# Patient Record
Sex: Female | Born: 1960 | Race: White | Hispanic: No | Marital: Married | Smoking: Former smoker
Health system: Southern US, Community
[De-identification: ages and names within clinical notes are randomized; demographics above are authoritative.]

## PROBLEM LIST (undated history)

## (undated) DIAGNOSIS — E042 Nontoxic multinodular goiter: Secondary | ICD-10-CM

## (undated) DIAGNOSIS — I4892 Unspecified atrial flutter: Secondary | ICD-10-CM

## (undated) DIAGNOSIS — R9430 Abnormal result of cardiovascular function study, unspecified: Secondary | ICD-10-CM

## (undated) DIAGNOSIS — F1011 Alcohol abuse, in remission: Secondary | ICD-10-CM

## (undated) DIAGNOSIS — M545 Low back pain, unspecified: Secondary | ICD-10-CM

## (undated) DIAGNOSIS — F329 Major depressive disorder, single episode, unspecified: Secondary | ICD-10-CM

## (undated) DIAGNOSIS — F1096 Alcohol use, unspecified with alcohol-induced persisting amnestic disorder: Secondary | ICD-10-CM

## (undated) DIAGNOSIS — F32A Depression, unspecified: Secondary | ICD-10-CM

## (undated) DIAGNOSIS — F0390 Unspecified dementia without behavioral disturbance: Secondary | ICD-10-CM

## (undated) DIAGNOSIS — IMO0002 Reserved for concepts with insufficient information to code with codable children: Secondary | ICD-10-CM

## (undated) HISTORY — DX: Depression, unspecified: F32.A

## (undated) HISTORY — PX: ANTERIOR CERVICAL DISCECTOMY: SHX1160

## (undated) HISTORY — DX: Nontoxic multinodular goiter: E04.2

## (undated) HISTORY — DX: Low back pain, unspecified: M54.50

## (undated) HISTORY — PX: OTHER SURGICAL HISTORY: SHX169

## (undated) HISTORY — DX: Reserved for concepts with insufficient information to code with codable children: IMO0002

## (undated) HISTORY — DX: Alcohol abuse, in remission: F10.11

## (undated) HISTORY — DX: Low back pain: M54.5

## (undated) HISTORY — DX: Unspecified atrial flutter: I48.92

## (undated) HISTORY — DX: Abnormal result of cardiovascular function study, unspecified: R94.30

## (undated) HISTORY — DX: Major depressive disorder, single episode, unspecified: F32.9

---

## 1987-04-23 HISTORY — PX: ABDOMINAL HYSTERECTOMY: SHX81

## 2000-12-19 ENCOUNTER — Encounter: Payer: Self-pay | Admitting: Neurosurgery

## 2000-12-19 ENCOUNTER — Ambulatory Visit (HOSPITAL_COMMUNITY): Admission: RE | Admit: 2000-12-19 | Discharge: 2000-12-19 | Payer: Self-pay | Admitting: Neurosurgery

## 2001-02-05 ENCOUNTER — Ambulatory Visit (HOSPITAL_COMMUNITY): Admission: RE | Admit: 2001-02-05 | Discharge: 2001-02-05 | Payer: Self-pay | Admitting: Neurosurgery

## 2001-02-05 ENCOUNTER — Encounter: Payer: Self-pay | Admitting: Neurosurgery

## 2002-04-22 HISTORY — PX: OTHER SURGICAL HISTORY: SHX169

## 2006-11-29 ENCOUNTER — Emergency Department (HOSPITAL_COMMUNITY): Admission: EM | Admit: 2006-11-29 | Discharge: 2006-11-29 | Payer: Self-pay | Admitting: Emergency Medicine

## 2008-02-09 LAB — CONVERTED CEMR LAB: Pap Smear: NORMAL

## 2009-12-21 ENCOUNTER — Encounter: Payer: Self-pay | Admitting: Internal Medicine

## 2009-12-24 ENCOUNTER — Ambulatory Visit: Payer: Self-pay | Admitting: Radiology

## 2009-12-24 ENCOUNTER — Emergency Department (HOSPITAL_BASED_OUTPATIENT_CLINIC_OR_DEPARTMENT_OTHER)
Admission: EM | Admit: 2009-12-24 | Discharge: 2009-12-24 | Payer: Self-pay | Source: Home / Self Care | Admitting: Emergency Medicine

## 2010-02-08 ENCOUNTER — Ambulatory Visit: Payer: Self-pay | Admitting: Diagnostic Radiology

## 2010-02-08 ENCOUNTER — Ambulatory Visit (HOSPITAL_BASED_OUTPATIENT_CLINIC_OR_DEPARTMENT_OTHER): Admission: RE | Admit: 2010-02-08 | Discharge: 2010-02-08 | Payer: Self-pay | Admitting: Internal Medicine

## 2010-02-08 ENCOUNTER — Ambulatory Visit: Payer: Self-pay | Admitting: Internal Medicine

## 2010-02-08 DIAGNOSIS — Z87891 Personal history of nicotine dependence: Secondary | ICD-10-CM | POA: Insufficient documentation

## 2010-02-08 DIAGNOSIS — F17200 Nicotine dependence, unspecified, uncomplicated: Secondary | ICD-10-CM

## 2010-02-08 HISTORY — DX: Personal history of nicotine dependence: Z87.891

## 2010-02-15 ENCOUNTER — Telehealth (INDEPENDENT_AMBULATORY_CARE_PROVIDER_SITE_OTHER): Payer: Self-pay | Admitting: *Deleted

## 2010-02-19 ENCOUNTER — Encounter: Payer: Self-pay | Admitting: Internal Medicine

## 2010-02-19 ENCOUNTER — Telehealth: Payer: Self-pay | Admitting: Internal Medicine

## 2010-02-21 ENCOUNTER — Encounter: Admission: RE | Admit: 2010-02-21 | Discharge: 2010-02-21 | Payer: Self-pay | Admitting: Internal Medicine

## 2010-02-21 LAB — HM MAMMOGRAPHY

## 2010-05-22 NOTE — Progress Notes (Signed)
Summary: mammogram question  Phone Note Call from Patient   Caller: Patient Call For: D. Thomos Lemons DO Summary of Call: Pt returned call [id number] results. Advised pt of results and that additional views are needed to further evaluate the calcifications. Pt states that she wants a copy of the results sent to her Gynecologist, Dr Okey Dupre @ [phone]. Pt states she does not want to have any unnecessary tests. Copy has been faxed.  Nicki Guadalajara Fergerson CMA Duncan Dull)  February 19, 2010 1:54 PM

## 2010-05-22 NOTE — Letter (Signed)
Summary: Health Screening Report  Health Screening Report   Imported By: Maryln Gottron 02/16/2010 12:47:54  _____________________________________________________________________  External Attachment:    Type:   Image     Comment:   External Document

## 2010-05-22 NOTE — Assessment & Plan Note (Signed)
Summary: TO EST /HEA   Vital Signs:  Patient profile:   50 year old female Height:      65.5 inches Weight:      149 pounds BMI:     24.51 O2 Sat:      99 % on Room air Temp:     97.8 degrees F oral Pulse rate:   74 / minute Pulse rhythm:   regular Resp:     20 per minute BP sitting:   112 / 72  (right arm) Cuff size:   regular  Vitals Entered By: Glendell Docker CMA (February 08, 2010 2:31 PM)  O2 Flow:  Room air CC: New Patient  Is Patient Diabetic? No Pain Assessment Patient in pain? no      Comments establish care-insurance policy   Primary Care Provider:  Dondra Spry DO  CC:  New Patient .  History of Present Illness: 50 y/o white female for routine cpx She denies chronic illness no chronic meds  she recently had blood work for [employer] lab results reviewed  pt is smoker since 1986 smokes 1 ppd she denies chronic cough,  sob or chest pain  Preventive Screening-Counseling & Management  Alcohol-Tobacco     Alcohol drinks/day: 1     Smoking Status: current     Packs/Day: 1.0     Year Started: 1985  Caffeine-Diet-Exercise     Caffeine use/day: 1 beverage daily     Does Patient Exercise: no  Past History:  Past Surgical History: Hysterectomy- 1989  (left oophorectomy) scar tissue removal 2004 C5 - C6, C6 - C7 anterior cervical diskectomy and fusion with allograft and anterior plating  Family History: Family History Other cancer- Mother deceased from "bone" cancer Father mid 99's - leukemia brother - DM II no CAD 3 siblings  Social History: Occupation: Community education officer) Married- 13 years  (2nd marriage) 1 son 40 ([place]) 1 daughter 1 ([place], [place]) Alcohol use-yes (social) Current smoker (1 ppd 1986)Smoking Status:  current Packs/Day:  1.0 Caffeine use/day:  1 beverage daily Does Patient Exercise:  no  Review of Systems  The patient denies fever, weight loss, weight gain, chest pain, syncope, abdominal pain, melena,  hematochezia, severe indigestion/heartburn, and depression.    Physical Exam  General:  alert, well-developed, and well-nourished.   Head:  normocephalic and atraumatic.   Eyes:  pupils equal, pupils round, and pupils reactive to light.   Ears:  R ear normal and L ear normal.   Mouth:  pharynx pink and moist.   Neck:  supple, full ROM, no masses, and no carotid bruits.   Lungs:  normal respiratory effort and normal breath sounds.   Heart:  normal rate, regular rhythm, and no gallop.   Abdomen:  soft, non-tender, normal bowel sounds, no hepatomegaly, and no splenomegaly.   Extremities:  No lower extremity edema Neurologic:  cranial nerves II-XII intact and gait normal.     Impression & Recommendations:  Problem # 1:  PREVENTIVE HEALTH CARE (ICD-V70.0) Reviewed adult health maintenance protocols.  Orders: Mammogram (Screening) (Mammo)  Mammogram: Declined (02/08/2010) Pap smear: normal (02/09/2008) Flu Vax: Declined (02/08/2010)     Problem # 2:  TOBACCO ABUSE (ICD-305.1) Assessment: Unchanged  Encouraged smoking cessation and discussed different methods for smoking cessation. 5 minutes spent on counseling pt  Patient Instructions: 1)  Schedule screening DEXA  2)  Please schedule a follow-up appointment in 1 year. 3)  Stop Smoking Tips: Choose a Quit date (within 1 week). Cut  down before the Quit date. decide what you will do as a substitute when you feel the urge to smoke(gum,toothpick,exercise). 4)  Use nicotine patches as directed   Orders Added: 1)  Mammogram (Screening) [Mammo] 2)  New Patient 40-64 years [99386]   Immunization History:  Influenza Immunization History:    Influenza:  declined (02/08/2010)   Contraindications/Deferment of Procedures/Staging:    Test/Procedure: Mammogram    Reason for deferment: patient declined     Test/Procedure: FLU VAX    Reason for deferment: patient declined   Immunization History:  Influenza Immunization  History:    Influenza:  Declined (02/08/2010)     Preventive Care Screening  Mammogram:    Date:  02/08/2010    Results:  Declined  Pap Smear:    Date:  02/09/2008    Results:  normal

## 2010-05-22 NOTE — Progress Notes (Signed)
  Phone Note Outgoing Call   Summary of Call: call pt - plz find out whether pt contacted about recent screening mammo results? Initial call taken by: D. Thomos Lemons DO,  February 15, 2010 5:15 PM  Follow-up for Phone Call        [facility] for Pt to CB if she is unaware of results Follow-up by: Payton Spark CMA,  February 16, 2010 9:49 AM

## 2010-05-22 NOTE — Letter (Signed)
Summary: Generic Letter  [facility]  [address]   [place], [place] [address]   Phone: [phone]  Fax: [phone]      02/08/2010    Kari Hoffman [address] [place], [place]  [address]     To Whom it may concern:  [name] 15-Jul-2060 had a physical exam on this date 02/08/2010. Blood pressure 112/72, Pulse 74, Respiration 20, BMI 24.51 Height 5'5" Pulse.   The following are recommendations screening mammogram, dexa, tobacco counseling.  Please call our office if additional information is needed          Sincerely,   Glendell Docker CMA Dr Thomos Lemons

## 2010-07-05 LAB — URINALYSIS, ROUTINE W REFLEX MICROSCOPIC
Bilirubin Urine: NEGATIVE
Ketones, ur: NEGATIVE mg/dL
Nitrite: NEGATIVE
Protein, ur: NEGATIVE mg/dL
Specific Gravity, Urine: 1.003 — ABNORMAL LOW (ref 1.005–1.030)
Urobilinogen, UA: 0.2 mg/dL (ref 0.0–1.0)

## 2010-07-05 LAB — URINE MICROSCOPIC-ADD ON

## 2010-09-07 NOTE — Op Note (Signed)
[facility]  Patient:    BERGEN, MELLE Visit Number: [id number] MRN: [id number]          Service Type: OTH Location: 3000 [id number] [id number] Attending Physician:  Donn Pierini Dictated by:   Julio Sicks, M.D. Proc. 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  PROCEDURE:  C5-6 and C6-7 anterior cervical diskectomy and fusion with allograft and anterior plating.  SURGEON:  Julio Sicks, M.D.  ASSISTANT:  Reinaldo Meeker, M.D.  ANESTHESIA:  General endotracheal.  INDICATION:  Ms. Cottrell is a 50 year old female with a history of chronic neck and left upper extremity pain, paresthesias, and weakness, consistent with both a left-sided C6 and C7 radiculopathy.  The patient has failed conservative management.  MRI scanning demonstrates rather marked spondylosis at C5-6 and C6-7 with compression of the exiting nerve root.  We discussed options available for management.  The patient decided to proceed with a C5-6 and C6-7 anterior cervical diskectomy and fusion with allograft and anterior plating for hopeful removal of her symptoms.  DESCRIPTION OF PROCEDURE:  Patient taken to the operating room, placed on the operating table in the supine position.  After an adequate level of anesthesia achieved, patient positioned supine with her neck slightly extended.  This was held in place with Holter traction.  The patients anterior cervical region was prepped and draped sterilely.  A 10 blade was used to make a linear skin incision on the right side.  This was carried down sharply to platysma.  The platysma was then divided vertically and dissection proceeded along the medial border of the sternocleidomastoid muscle and carotid sheath.   Trachea and esophagus were mobilized and retracted to the left.  The prevertebral fascia was stripped off the anterior spinal column.  The longus colli muscle was then elevated bilaterally using electrocautery.  Deep self-retaining retractor was placed.  Intraoperative fluoroscopy was used, and the C5-6 and C6-7 levels were confirmed.  The disk space was then incised with a 15 blade in rectangular fashion at both levels.  A wide disk space clean-out was then achieved at both levels using pituitary rongeurs, forward and backward-angled Karlin curettes, Kerrison rongeurs, and the high-speed drill.  All elements of the disk were removed down to the level of the posterior annulus at both levels.  The microscope was brought into the field and used throughout the remainder of the diskectomies.  Starting first at C5-6, the remaining aspect of the annulus and osteophytes were removed using the high-speed drill down to the level of the posterior longitudinal ligament.  The posterior longitudinal ligament was then elevated and resected in piecemeal fashion utilizing Kerrison rongeurs.  The underlying thecal sac was identified.  A wide central decompression was then performed.  Decompression then proceeded down to the anterior neural foramen.  The C6 nerve roots were identified bilaterally and widely decompressed throughout the course of their foramina.  After very thorough decompression had been achieved, the procedure was repeated at the C6-7 level, again without complication.  Both disk spaces were then irrigated with antibiotic solution.  Gelfoam was placed topically for hemostasis, found to be good.  The disk spaces were then distracted  and fibular wedge allografts were packed into place at each level.  An Atlantis anterior cervical plate was then placed over the C5, C6, and C7 levels.  This was then attached under fluoroscopic guidance utilizing variable-angled cancellous screws.  Two  screws each were placed at C5, C6, and C7.  All screws were found to be well-positioned.  Screws were given a final tightening and found to be solidly within bone.  The locking screws were engaged.  Retractor system was removed. Hemostasis achieved with the bipolar electrocautery.  Final images with the fluoroscopy revealed good position of bone grafts and hardware and proper operative level, with normal alignment of the spine.  The wound was inspected one final time for hemostasis.  It was then closed in routine fashion.  There were no apparent complications.  The patient tolerated the procedure well, and she returns to the recovery room postop. Dictated by:   Julio Sicks, M.D. Attending Physician:  Donn Pierini DD:  01/28/01 TD:  01/29/01 Job: [id number]

## 2010-11-09 ENCOUNTER — Encounter: Payer: Self-pay | Admitting: Internal Medicine

## 2010-11-09 ENCOUNTER — Ambulatory Visit (INDEPENDENT_AMBULATORY_CARE_PROVIDER_SITE_OTHER): Payer: BC Managed Care – PPO | Admitting: Internal Medicine

## 2010-11-09 DIAGNOSIS — J40 Bronchitis, not specified as acute or chronic: Secondary | ICD-10-CM

## 2010-11-09 MED ORDER — DOXYCYCLINE HYCLATE 100 MG PO TABS: 100.0000 mg | ORAL_TABLET | Freq: Two times a day (BID) | ORAL | Status: DC

## 2010-11-10 DIAGNOSIS — J40 Bronchitis, not specified as acute or chronic: Secondary | ICD-10-CM | POA: Insufficient documentation

## 2010-11-10 NOTE — Progress Notes (Signed)
  Subjective:    Patient ID: Kari Hoffman, female    DOB: 1960-05-29, 50 y.o.   MRN: [id number]  HPI Patient presents to clinic for evaluation of cough. Notes two-week history of cough, nasal congestion and drainage as well as bilateral year congestion and pressure. Does smoke tobacco. Has been attempting over-the-counter medication. Has improved but symptoms remain persistent. Cough is now clear. Denies fever, chills, hemoptysis, shortness of breath or wheezing. No other exacerbating or alleviating factors. No other complaints.  Reviewed past medical history, medications and allergies    Review of Systems see history of present illness     Objective:   Physical Exam    Physical Exam  Vitals reviewed. Constitutional:  appears well-developed and well-nourished. No distress.  HENT:  Head: Normocephalic and atraumatic.  Right Ear: Tympanic membrane, external ear and ear canal normal.  Left Ear: Tympanic membrane, external ear and ear canal normal.  Nose: Nose normal.  Mouth/Throat: Oropharynx is clear and moist. No oropharyngeal exudate.  Eyes: Conjunctivae and EOM are normal. Pupils are equal, round, and reactive to light. Right eye exhibits no discharge. Left eye exhibits no discharge. No scleral icterus.  Neck: Neck supple. No thyromegaly present.  Cardiovascular: Normal rate, regular rhythm and normal heart sounds.  Exam reveals no gallop and no friction rub.   No murmur heard. Pulmonary/Chest: Effort normal and breath sounds normal. No respiratory distress.  has no wheezes.  has no rales.  Lymphadenopathy:   no cervical adenopathy.  Neurological:  is alert.  Skin: Skin is warm and dry.  not diaphoretic.  Psychiatric: normal mood and affect.      Assessment & Plan:

## 2010-11-10 NOTE — Assessment & Plan Note (Signed)
Begin antibiotic therapy with doxycycline x10 days. Continue over-the-counter medication as needed.Followup if no improvement or worsening.

## 2010-11-14 ENCOUNTER — Encounter: Payer: Self-pay | Admitting: Internal Medicine

## 2010-11-16 ENCOUNTER — Telehealth: Payer: Self-pay | Admitting: Internal Medicine

## 2010-11-16 MED ORDER — AZITHROMYCIN 250 MG PO TABS: ORAL_TABLET | ORAL | Status: DC

## 2010-11-16 NOTE — Telephone Encounter (Signed)
Patient states that she has been taking doxyclicline that Dr. Rodena Medin prescribed her for her earache. Her is still bothering her and wonders if something else could be called in?

## 2010-11-16 NOTE — Telephone Encounter (Signed)
Call placed to patient at [phone], she was asked about allergies to Zithromax. She stated she does not think that she has had that medication before,and denies having medication allergy. She was informed Z -pak would be sent to the pharmacy, and if there was no improvement by Monday or Tuesday of next week to return for evaluation. Patient has verbalized understanding and agrees as instructed.

## 2010-11-16 NOTE — Telephone Encounter (Signed)
zpak is not allergic and no interactions

## 2010-11-19 ENCOUNTER — Ambulatory Visit (INDEPENDENT_AMBULATORY_CARE_PROVIDER_SITE_OTHER): Payer: BC Managed Care – PPO | Admitting: Internal Medicine

## 2010-11-19 ENCOUNTER — Encounter: Payer: Self-pay | Admitting: Internal Medicine

## 2010-11-19 DIAGNOSIS — J40 Bronchitis, not specified as acute or chronic: Secondary | ICD-10-CM

## 2010-11-19 MED ORDER — AZITHROMYCIN 250 MG PO TABS: ORAL_TABLET | ORAL | Status: AC

## 2010-11-19 NOTE — Progress Notes (Signed)
  Subjective:    Patient ID: Kari Hoffman, female    DOB: 1961/01/31, 50 y.o.   MRN: [id number]  HPI Pt presents to clinic for followup of uri. Recently treated with doxycycline followed by zpak which is currently day #5/7. Cough and nasal congestion improved but left ear fullness/pressure remains. No alleviating or exacerbating factors. No no other medications. No other complaints.  Reviewed pmh, medications and allergies.    Review of Systems  Constitutional: Negative for fever and chills.  HENT: Positive for ear pain and congestion.   Respiratory: Negative for cough and shortness of breath.         Objective:   Physical Exam  Nursing note and vitals reviewed. Constitutional: She appears well-developed and well-nourished. No distress.  HENT:  Head: Normocephalic and atraumatic.  Right Ear: Tympanic membrane, external ear and ear canal normal.  Left Ear: Tympanic membrane and ear canal normal.  Nose: Nose normal.  Mouth/Throat: Oropharynx is clear and moist. No oropharyngeal exudate.       Clear drainage in op  Eyes: Conjunctivae are normal. No scleral icterus.  Neck: Neck supple.  Pulmonary/Chest: Effort normal and breath sounds normal. No respiratory distress. She has no wheezes. She has no rales.  Neurological: She is alert.  Skin: Skin is warm and dry. She is not diaphoretic.  Psychiatric: She has a normal mood and affect.          Assessment & Plan:   No problem-specific assessment & plan notes found for this encounter.

## 2010-11-19 NOTE — Assessment & Plan Note (Signed)
Improving but with persistent ear sx's. Extend abx. Attempt otc claritin and pseudopheb.

## 2010-11-22 ENCOUNTER — Encounter: Payer: BC Managed Care – PPO | Admitting: Internal Medicine

## 2010-11-22 DIAGNOSIS — Z0289 Encounter for other administrative examinations: Secondary | ICD-10-CM

## 2012-01-10 ENCOUNTER — Ambulatory Visit (INDEPENDENT_AMBULATORY_CARE_PROVIDER_SITE_OTHER): Payer: BC Managed Care – PPO | Admitting: Family

## 2012-01-10 ENCOUNTER — Ambulatory Visit (HOSPITAL_BASED_OUTPATIENT_CLINIC_OR_DEPARTMENT_OTHER)
Admission: RE | Admit: 2012-01-10 | Discharge: 2012-01-10 | Disposition: A | Discharge status: Home / Self Care | Payer: BC Managed Care – PPO | Source: Ambulatory Visit | Attending: Family | Admitting: Family

## 2012-01-10 ENCOUNTER — Encounter: Payer: Self-pay | Admitting: Family

## 2012-01-10 VITALS — BP 110/70 | HR 60 | Temp 98.9°F | Resp 16 | Wt 173.0 lb

## 2012-01-10 DIAGNOSIS — R609 Edema, unspecified: Secondary | ICD-10-CM | POA: Insufficient documentation

## 2012-01-10 DIAGNOSIS — M25473 Effusion, unspecified ankle: Secondary | ICD-10-CM

## 2012-01-10 DIAGNOSIS — M25579 Pain in unspecified ankle and joints of unspecified foot: Secondary | ICD-10-CM | POA: Insufficient documentation

## 2012-01-10 DIAGNOSIS — M19079 Primary osteoarthritis, unspecified ankle and foot: Secondary | ICD-10-CM | POA: Insufficient documentation

## 2012-01-10 DIAGNOSIS — M25472 Effusion, left ankle: Secondary | ICD-10-CM

## 2012-01-10 LAB — URIC ACID: Uric Acid, Serum: 6.1 mg/dL (ref 2.4–7.0)

## 2012-01-10 NOTE — Progress Notes (Signed)
  Subjective:    Patient ID: Kari Hoffman, female    DOB: 1960/12/11, 51 y.o.   MRN: [id number]  HPI  Kari Hoffman is a 51 yr old female who presents today with chief complaint of left ankle swelling. Swelling has been present x 3 weeks.  Yesterday, swelling seemed to worsen.  Reports that the left ankle is tender.  She denies associated fever, calf pain/swelling, or shortness of breath.  Denies known injury.  Swelling worsens as the day goes on.   Review of Systems See HPI  No past medical history on file.  History   Social History  . Marital Status: Married    Spouse Name: N/A    Number of Children: 2  . 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 Smoking status: Former Smoker -- 1.0 packs/day    Quit date: 07/10/2011  . Smokeless tobacco: Not on file   Comment: smoking since 1986  . Alcohol Use: Yes     socially  . Drug Use: Not on file  . Sexually Active: Not on file   Other Topics Concern  . Not on file   Social History Narrative   Married 13 yrs (2nd marriage)    Past Surgical History  Procedure Date  . Anterior cervical discectomy   . Abdominal hysterectomy 1989    left oophorectomy  . Scar tissue removal  2004  . C5- c6, c6- c7 anterior cervical diskectomy and fusion with allograft and anterir plating     Family History  Problem Relation Age of Onset  . Cancer Mother     bone  . Leukemia Father   . Diabetes Brother     type 2  . Coronary artery disease Neg Hx     No Known Allergies  No current outpatient prescriptions on file prior to visit.    BP 110/70  Pulse 60  Temp 98.9 F (37.2 C) (Oral)  Resp 16  Wt 173 lb (78.472 kg)  SpO2 99%       Objective:   Physical Exam  Constitutional: She appears well-developed and well-nourished. No distress.  Musculoskeletal:       + left ankle swelling, no redness.  Full ROM, mild tenderness to palpation.  No calf swelling.           Assessment  & Plan:

## 2012-01-10 NOTE — Assessment & Plan Note (Signed)
Will check x-ray to rule out fracture as well as a uric acid level to evaluate for gout.  In meantime, recommended ace wrap and ibuprofen prn.  Pt instructed to call if she develops redness, if symptoms worsen, or if symptoms do not improve.

## 2012-01-10 NOTE — Patient Instructions (Addendum)
Please complete blood work prior to leaving. Complete x-ray on the first floor.  You may use ibuprofen as needed the next week.

## 2012-01-14 ENCOUNTER — Inpatient Hospital Stay (HOSPITAL_BASED_OUTPATIENT_CLINIC_OR_DEPARTMENT_OTHER): Admission: RE | Admit: 2012-01-14 | Payer: BC Managed Care – PPO | Source: Ambulatory Visit

## 2012-01-14 ENCOUNTER — Telehealth: Payer: Self-pay | Admitting: Family

## 2012-01-14 DIAGNOSIS — M25472 Effusion, left ankle: Secondary | ICD-10-CM

## 2012-01-14 NOTE — Telephone Encounter (Signed)
Notified pt that u/s will need to be done prior to appt with Dr Pearletha Forge tomorrow. R/s u/s for 11:30am. Pt has been made aware and is agreeable.

## 2012-01-14 NOTE — Telephone Encounter (Signed)
Notified pt. 

## 2012-01-14 NOTE — Telephone Encounter (Signed)
Spoke to pt re: her concerns.  She expressed frustration with not understanding cause of her swelling.  I expressed to her that it is likely related to a sprain, however will also set her up for a LLE doppler to exclude DVT and a consultation with Dr. Pearletha Forge for further evaluation.  She is agreeable to proceed. "I want to do what I need to do." She reports no swelling today- she is off of work today and has been elevating.  She has also applied an ACE ankle brace which is helping.  Offered to write her an excuse note for work, but she declines.  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 She expressed concern about possible kidney or liver disease being a cause- I discussed with her that it would be unusual to have unilateral swelling if this were the case.    Please call patient and let her know that I have scheduled her at 3:30 PM for a doppler in the imaging department.   Apt tomorrow with Dr. Pearletha Forge is arranged at 2:30 PM.

## 2012-01-15 ENCOUNTER — Ambulatory Visit (HOSPITAL_BASED_OUTPATIENT_CLINIC_OR_DEPARTMENT_OTHER)
Admission: RE | Admit: 2012-01-15 | Discharge: 2012-01-15 | Disposition: A | Discharge status: Home / Self Care | Payer: BC Managed Care – PPO | Source: Ambulatory Visit | Attending: Family | Admitting: Family

## 2012-01-15 ENCOUNTER — Ambulatory Visit (INDEPENDENT_AMBULATORY_CARE_PROVIDER_SITE_OTHER): Payer: BC Managed Care – PPO | Admitting: Family Medicine

## 2012-01-15 ENCOUNTER — Telehealth: Payer: Self-pay | Admitting: Family

## 2012-01-15 ENCOUNTER — Encounter: Payer: Self-pay | Admitting: Family Medicine

## 2012-01-15 VITALS — BP 115/77 | HR 60 | Ht 65.0 in | Wt 172.0 lb

## 2012-01-15 DIAGNOSIS — M7989 Other specified soft tissue disorders: Secondary | ICD-10-CM | POA: Insufficient documentation

## 2012-01-15 DIAGNOSIS — M25579 Pain in unspecified ankle and joints of unspecified foot: Secondary | ICD-10-CM

## 2012-01-15 DIAGNOSIS — M25472 Effusion, left ankle: Secondary | ICD-10-CM

## 2012-01-15 DIAGNOSIS — R609 Edema, unspecified: Secondary | ICD-10-CM

## 2012-01-15 DIAGNOSIS — M25572 Pain in left ankle and joints of left foot: Secondary | ICD-10-CM

## 2012-01-15 NOTE — Telephone Encounter (Signed)
Patient returned phone call. She would like a call back before 1:45. Best # [phone]

## 2012-01-15 NOTE — Telephone Encounter (Signed)
Left message requesting that pt return our call.  When pt calls back pls let her know that her doppler is negative for DVT.  It notes a small baker's cyst- which is common cyst behind the knee. Baker's cysts are not dangerous- but sometimes can cause knee swelling/stiffness if they become large.    I doubt that the baker's cyst is related to her ankle swelling.  I have forwarded these results to Dr. Pearletha Forge- he can take a closer look at her knee and the cyst.

## 2012-01-15 NOTE — Telephone Encounter (Signed)
Spoke with patient.  Reviewed ultrasound results with her. She verbalizes understanding.  She is to meet with Dr. Pearletha Forge today at 2:30.

## 2012-01-17 ENCOUNTER — Encounter: Payer: Self-pay | Admitting: Family Medicine

## 2012-01-17 NOTE — Progress Notes (Signed)
  Subjective:    Patient ID: Kari Hoffman, female    DOB: March 12, 1961, 51 y.o.   MRN: [id number]  PCP: Sandford Craze  HPI 51 yo F here for left ankle pain, swelling.  Patient reports for about 4 weeks has had insidious onset of left leg, ankle swelling. No bruising. No known injury but reports she may have twisted ankle coming out of restaurant about when this started - didn't recall problems walking after this though. Pain and swelling worse at end of day. No issues with right leg. She had x-rays which showed no fracture or other acute process - did have some DJD. Also had uric acid of 6.1 (no prior h/o gout), doppler u/s negative for dvt.  History reviewed. No pertinent past medical history.  No current outpatient prescriptions on file prior to visit.    Past Surgical History  Procedure Date  . Anterior cervical discectomy   . Abdominal hysterectomy 1989    left oophorectomy  . Scar tissue removal  2004  . C5- c6, c6- c7 anterior cervical diskectomy and fusion with allograft and anterir plating     No Known Allergies  History   Social History  . Marital Status: Married    Spouse Name: N/A    Number of Children: 2  . 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 Smoking status: Former Smoker -- 1.0 packs/day    Quit date: 07/10/2011  . Smokeless tobacco: Not on file   Comment: smoking since 1986  . Alcohol Use: Yes     socially  . Drug Use: Not on file  . Sexually Active: Not on file   Other Topics Concern  . Not on file   Social History Narrative   Married 13 yrs (2nd marriage)    Family History  Problem Relation Age of Onset  . Cancer Mother     bone  . Leukemia Father   . Diabetes Brother     type 2  . Coronary artery disease Neg Hx     BP 115/77  Pulse 60  Ht 5\' 5"  (1.651 m)  Wt 172 lb (78.019 kg)  BMI 28.62 kg/m2  Review of Systems See HPI above.    Objective:   Physical Exam Gen:  NAD  L ankle: 1-2+ pitting edema lower leg.  No other gross deformity, ecchymoses FROM with 5/5 strength all directions without pain. TTP over distal tibia and fibula mildly.  No ankle joint, malleolar, base 5th, talus, navicular, fibular head, other TTP about ankle. Negative ant drawer and talar tilt.   Negative syndesmotic compression. Thompsons test negative. NV intact distally.    Assessment & Plan:  1. Left ankle pain - Patient's ankle exam is benign without prior injury, normal x-rays, negative doppler u/s, normal uric acid (in some labs 6.0 is upper limit of normal however but by exam no erythema, warmth to suggest gouty attack).  Does not exercise regularly to suggest stress fracture of fibula or tibia.  Pitting lower leg edema with pain worse at end of day suggests chronic venous insuffiency.  CMP ordered to ensure her renal and liver function (albumin, protein specifically when low can lead to edema) are normal.  Assuming this is the case would try compression stockings at pressure (script written) and consider lasix.  I do not think further imaging of lower extremity would be beneficial based on her exam.

## 2012-01-20 ENCOUNTER — Telehealth: Payer: Self-pay | Admitting: Internal Medicine

## 2012-01-20 NOTE — Telephone Encounter (Signed)
Pt returned my call and was advised of below instructions.  Pt voices understanding.  She states that she has not been able to purchase stockings yet due to her work schedule. Result received and forwarded to Provider for review.  Please advise.

## 2012-01-20 NOTE — Telephone Encounter (Signed)
Spoke with pt at 7:45 pm.  Will review labs in AM when I return to office.  Discussed with pt that I reviewed case with Dr. Pearletha Forge and at this point her swelling seems most consistent with venous insufficiency.  Pending review of lab work we can hopefully add a prn diuretic to help with her swelling.  I also reviewed with pt that use of compression stockings, especially while standing at work will likely help with her swelling and discomfort.  Pt. Verbalized understanding.

## 2012-01-20 NOTE — Telephone Encounter (Signed)
Patient is requesting last lab results. Also, she states that her ankle is not any better since last week.

## 2012-01-20 NOTE — Telephone Encounter (Signed)
Had lab test Friday and is requesting result? Pt states swelling has increased and is moving up into her leg. States her leg feels very tight. Denies redness or warmth to touch. Reports that Dr Pearletha Forge recommended that she try wearing a stocking. [employer] and they will fax result. Pt would like test results and to know what she should do next?    Per verbal from Provider, awaiting result from [facility] and to advise pt to continue wearing stocking recommended by Dr Pearletha Forge. If pt develops redness of leg or temperature over night to be evaluated in the ER otherwise we will contact her tomorrow morning once we have received results. Attempted to reach pt and left message to return my call.

## 2012-01-21 DIAGNOSIS — M25572 Pain in left ankle and joints of left foot: Secondary | ICD-10-CM | POA: Insufficient documentation

## 2012-01-21 MED ORDER — FUROSEMIDE 20 MG PO TABS: 20.0000 mg | ORAL_TABLET | Freq: Every day | ORAL | Status: DC

## 2012-01-21 NOTE — Telephone Encounter (Signed)
Kari Hoffman- Pls call pt and let her know labs are normal- normal kidney function, normal liver function.  I recommend that she start furosemide 20mg  once daily in the morning as needed for swelling.  Wear support stockings during day and especially while at work. Follow up in 2 weeks.

## 2012-01-21 NOTE — Assessment & Plan Note (Signed)
Patient's ankle exam is benign without prior injury, normal x-rays, negative doppler u/s, normal uric acid (in some labs 6.0 is upper limit of normal however but by exam no erythema, warmth to suggest gouty attack). Does not exercise regularly to suggest stress fracture of fibula or tibia. Pitting lower leg edema with pain worse at end of day suggests chronic venous insuffiency. CMP ordered to ensure her renal and liver function (albumin, protein specifically when low can lead to edema) are normal. Assuming this is the case would try compression stockings at pressure (script written) and consider lasix. I do not think further imaging of lower extremity would be beneficial based on her exam.

## 2012-01-21 NOTE — Telephone Encounter (Signed)
Notified pt and she voices understanding. Follow up scheduled for 02/05/12 at 9:30am.

## 2012-02-05 ENCOUNTER — Encounter: Payer: Self-pay | Admitting: Family

## 2012-02-05 ENCOUNTER — Ambulatory Visit: Payer: BC Managed Care – PPO | Admitting: Family

## 2012-02-05 ENCOUNTER — Ambulatory Visit (INDEPENDENT_AMBULATORY_CARE_PROVIDER_SITE_OTHER): Payer: BC Managed Care – PPO | Admitting: Family

## 2012-02-05 VITALS — BP 132/78 | HR 81 | Temp 97.6°F | Resp 12 | Ht 66.0 in | Wt 169.0 lb

## 2012-02-05 DIAGNOSIS — M25473 Effusion, unspecified ankle: Secondary | ICD-10-CM

## 2012-02-05 DIAGNOSIS — M25472 Effusion, left ankle: Secondary | ICD-10-CM

## 2012-02-05 DIAGNOSIS — R609 Edema, unspecified: Secondary | ICD-10-CM

## 2012-02-05 LAB — BASIC METABOLIC PANEL
BUN: 11 mg/dL (ref 6–23)
Chloride: 100 mEq/L (ref 96–112)
Creat: 0.74 mg/dL (ref 0.50–1.10)

## 2012-02-05 MED ORDER — FUROSEMIDE 20 MG PO TABS: 20.0000 mg | ORAL_TABLET | Freq: Every day | ORAL | Status: DC

## 2012-02-05 NOTE — Patient Instructions (Addendum)
Please schedule a follow up appointment in 6 months.   

## 2012-02-05 NOTE — Progress Notes (Signed)
  Subjective:    Patient ID: Kari Hoffman, female    DOB: 19-Jun-1960, 51 y.o.   MRN: [id number]  HPI  Kari Hoffman is a 51 yr old female who presents today for follow up of her LLE edema. She has been using support hose and furosemide without problem. Reports that she has had no further issues with edema with this treatment.    Review of Systems See HPI  History reviewed. No pertinent past medical history.  History   Social History  . Marital Status: Married    Spouse Name: N/A    Number of Children: 2  . 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 Smoking status: Former Smoker -- 1.0 packs/day    Quit date: 07/10/2011  . Smokeless tobacco: Not on file   Comment: smoking since 1986  . Alcohol Use: Yes     socially  . Drug Use: Not on file  . Sexually Active: Not on file   Other Topics Concern  . Not on file   Social History Narrative   Married 13 yrs (2nd marriage)    Past Surgical History  Procedure Date  . Anterior cervical discectomy   . Abdominal hysterectomy 1989    left oophorectomy  . Scar tissue removal  2004  . C5- c6, c6- c7 anterior cervical diskectomy and fusion with allograft and anterir plating     Family History  Problem Relation Age of Onset  . Cancer Mother     bone  . Leukemia Father   . Diabetes Brother     type 2  . Coronary artery disease Neg Hx     No Known Allergies  Current Outpatient Prescriptions on File Prior to Visit  Medication Sig Dispense Refill  . DISCONTD: furosemide (LASIX) 20 MG tablet Take 1 tablet (20 mg total) by mouth daily.  30 tablet  0    BP 132/78  Pulse 81  Temp 97.6 F (36.4 C) (Oral)  Resp 12  Ht 5\' 6"  (1.676 m)  Wt 169 lb 0.6 oz (76.676 kg)  BMI 27.28 kg/m2  SpO2 98%       Objective:   Physical Exam  Constitutional: She appears well-developed and well-nourished. No distress.  Cardiovascular: Normal rate and regular rhythm.   No murmur  heard. Pulmonary/Chest: Effort normal and breath sounds normal. No respiratory distress. She has no wheezes. She has no rales. She exhibits no tenderness.  Musculoskeletal: She exhibits no edema.  Psychiatric: Her behavior is normal. Judgment and thought content normal.          Assessment & Plan:

## 2012-02-05 NOTE — Assessment & Plan Note (Signed)
Resolved.  Likely due to venous insufficiency.  Reviewed with patient in detail. She expresses concern about not wanting to take medication.  I explained to patient that if this is the case she can use the support stockings daily and the furosemide as needed.  Obtain bmet today.  I did ask pt if she was up to date on her preventative measures. She tells me that she does not wish to pusue these measures. Specifically, she does not wish to have mammogram or colonoscopy.  "I do not see any need for it."

## 2012-02-07 ENCOUNTER — Encounter: Payer: Self-pay | Admitting: Family

## 2012-08-05 ENCOUNTER — Ambulatory Visit: Payer: BC Managed Care – PPO | Admitting: Family

## 2012-12-15 ENCOUNTER — Ambulatory Visit (INDEPENDENT_AMBULATORY_CARE_PROVIDER_SITE_OTHER): Payer: BC Managed Care – PPO | Admitting: Family

## 2012-12-15 ENCOUNTER — Encounter: Payer: Self-pay | Admitting: Family

## 2012-12-15 VITALS — BP 130/80 | HR 88 | Temp 98.0°F | Resp 16 | Ht 66.75 in | Wt 178.0 lb

## 2012-12-15 DIAGNOSIS — Z13220 Encounter for screening for lipoid disorders: Secondary | ICD-10-CM

## 2012-12-15 DIAGNOSIS — E785 Hyperlipidemia, unspecified: Secondary | ICD-10-CM

## 2012-12-15 DIAGNOSIS — Z0000 Encounter for general adult medical examination without abnormal findings: Secondary | ICD-10-CM

## 2012-12-15 LAB — BASIC METABOLIC PANEL
BUN: 11 mg/dL (ref 6–23)
Chloride: 102 mEq/L (ref 96–112)
Creat: 0.89 mg/dL (ref 0.50–1.10)
Glucose, Bld: 118 mg/dL — ABNORMAL HIGH (ref 70–99)
Potassium: 4.2 mEq/L (ref 3.5–5.3)

## 2012-12-15 LAB — LIPID PANEL
Cholesterol: 171 mg/dL (ref 0–200)
Total CHOL/HDL Ratio: 2.2 Ratio
Triglycerides: 67 mg/dL (ref <150)
VLDL: 13 mg/dL (ref 0–40)

## 2012-12-15 NOTE — Progress Notes (Signed)
  Subjective:    Patient ID: Kari Hoffman, female    DOB: Oct 29, 1960, 52 y.o.   MRN: [id number]  HPI  Kari Hoffman is a 52 yr old female who presents today to have a form filled for work.  She declines physical exam.  Form is requiring lipid panel. She is not fasting today. Offered to have her return fasting tomorrow, but she declines. "I'm here now."    Review of Systems See HPI  History reviewed. No pertinent past medical history.  History   Social History  . Marital Status: Married    Spouse Name: N/A    Number of Children: 2  . 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 Smoking status: Former Smoker -- 1.00 packs/day    Quit date: 07/10/2011  . Smokeless tobacco: Not on file     Comment: smoking since 1986  . Alcohol Use: Yes     Comment: socially  . Drug Use: Not on file  . Sexual Activity: Not on file   Other Topics Concern  . Not on file   Social History Narrative   Married 13 yrs (2nd marriage)    Past Surgical History  Procedure Laterality Date  . Anterior cervical discectomy    . Abdominal hysterectomy  1989    left oophorectomy  . Scar tissue removal   2004  . C5- c6, c6- c7 anterior cervical diskectomy and fusion with allograft and anterir plating      Family History  Problem Relation Age of Onset  . Cancer Mother     bone  . Leukemia Father   . Diabetes Brother     type 2  . Coronary artery disease Neg Hx     No Known Allergies  No current outpatient prescriptions on file prior to visit.   No current facility-administered medications on file prior to visit.    BP 130/80  Pulse 88  Temp(Src) 98 F (36.7 C) (Oral)  Resp 16  Ht 5' 6.75" (1.695 m)  Wt 178 lb 0.6 oz (80.758 kg)  BMI 28.11 kg/m2  SpO2 97%       Objective:   Physical Exam  Constitutional: She appears well-developed and well-nourished. No distress.  Psychiatric: Judgment and thought content normal. Cognition and memory  are normal.  Pt irritable today          Assessment & Plan:

## 2012-12-15 NOTE — Assessment & Plan Note (Addendum)
Check non-fasting lipid panel today.  Form also requesting glucose level.  Obtain bmet.

## 2012-12-15 NOTE — Patient Instructions (Addendum)
Please complete lab work prior to leaving.   

## 2012-12-16 ENCOUNTER — Telehealth: Payer: Self-pay | Admitting: Family

## 2012-12-16 NOTE — Telephone Encounter (Signed)
Left message on home # to return my call. Form faxed to [phone] and original mailed to pt.

## 2012-12-16 NOTE — Telephone Encounter (Signed)
Patient returned phone call. Best # [phone]

## 2012-12-16 NOTE — Telephone Encounter (Signed)
Notified pt. 

## 2012-12-16 NOTE — Telephone Encounter (Signed)
Please call pt and let her know that her form is complete.  Cholesterol levels look good. Sugar was mildly elevated but not in diabetic range.  Likely elevated as she was not fasting at time of the test.

## 2013-03-10 ENCOUNTER — Telehealth: Payer: Self-pay | Admitting: Family

## 2013-03-10 ENCOUNTER — Encounter: Payer: Self-pay | Admitting: Family

## 2013-03-10 ENCOUNTER — Ambulatory Visit (INDEPENDENT_AMBULATORY_CARE_PROVIDER_SITE_OTHER): Payer: BC Managed Care – PPO | Admitting: Family

## 2013-03-10 VITALS — BP 150/86 | HR 71 | Temp 98.1°F | Resp 16 | Ht 66.75 in | Wt 171.1 lb

## 2013-03-10 DIAGNOSIS — I4892 Unspecified atrial flutter: Secondary | ICD-10-CM

## 2013-03-10 DIAGNOSIS — F1010 Alcohol abuse, uncomplicated: Secondary | ICD-10-CM | POA: Insufficient documentation

## 2013-03-10 DIAGNOSIS — F17200 Nicotine dependence, unspecified, uncomplicated: Secondary | ICD-10-CM

## 2013-03-10 DIAGNOSIS — Z8679 Personal history of other diseases of the circulatory system: Secondary | ICD-10-CM | POA: Insufficient documentation

## 2013-03-10 DIAGNOSIS — N289 Disorder of kidney and ureter, unspecified: Secondary | ICD-10-CM

## 2013-03-10 HISTORY — DX: Personal history of other diseases of the circulatory system: Z86.79

## 2013-03-10 NOTE — Telephone Encounter (Signed)
Pls notify Kari Hoffman that we have received her records from [facility]. Please reschedule hospital follow up at her convenience.

## 2013-03-10 NOTE — Progress Notes (Signed)
  Subjective:    Patient ID: Kari Hoffman, female    DOB: [birth year], 52 y.o.   MRN: [id number]  HPI  Ms. Friesenhahn scheduled ED follow up from [facility] ED visit.  ED records have been requested but not received yet from [facility].   Asked patient how she is feeling and to give me more information re: why she was seen and evaluated in the ED at [facility]. Pt stated: "I guess we need to reschedule" and walked out of the exam room.   Review of Systems     Objective:   Physical Exam  Not examined.         Assessment & Plan:

## 2013-03-10 NOTE — Telephone Encounter (Signed)
Notified pt and she voices understanding. Pt has met all of her deductible for this year and is wanting referrals to specialists ASAP. Pt is concerned about possible CKD and wants referral to kidney specialist as well.  Advised her we always try to get first available appts but she can discuss this in further detail at her appt on Monday. Pt voices understanding.

## 2013-03-10 NOTE — Telephone Encounter (Signed)
I spoke to patient and scheduled her to be seen on Monday, 03/15/13. Patient then started crying, stating that she looked at her hospital transcript and is concerned that there may be something serious going on with her health. She would like a callback regarding this.

## 2013-03-10 NOTE — Telephone Encounter (Signed)
Reviewed records. She had atrial flutter- fast heart rhythm.  Her heart rate was good in the office.  Kidney function showed mild dehydration. When she returns to the office we will check EKG and kidney function and likely place referral to cardiology.  They also noted small thyroid nodules which are common and we will plan to repeat her thyroid US in 1 year to monitor.  I think it is fine for her to come in on Monday.  Go to ER if she develops shortness of breath or chest pain.

## 2013-03-11 NOTE — Telephone Encounter (Signed)
Notified pt. She states she is on her way to work today but will be able to complete bmet tomorrow. Lab order entered.

## 2013-03-11 NOTE — Telephone Encounter (Signed)
Left message on home # to return my call. Cell # is not accepting incoming messages.

## 2013-03-11 NOTE — Telephone Encounter (Signed)
I don't think she has chronic kidney disease. I would like her to repeat bmet (she can come today if she wants to lab- dx renal insufficiency).  I suspect the follow up kidney function will be normal.  If it is normal, then I don't think we need to refer to nephrology.  I will place order for cardiology consult today.

## 2013-03-12 ENCOUNTER — Institutional Professional Consult (permissible substitution): Payer: BC Managed Care – PPO | Admitting: Cardiology

## 2013-03-15 ENCOUNTER — Encounter: Payer: Self-pay | Admitting: Family

## 2013-03-15 ENCOUNTER — Ambulatory Visit (INDEPENDENT_AMBULATORY_CARE_PROVIDER_SITE_OTHER): Payer: BC Managed Care – PPO | Admitting: Family

## 2013-03-15 VITALS — BP 130/78 | HR 57 | Temp 98.3°F | Resp 16 | Ht 66.75 in | Wt 175.0 lb

## 2013-03-15 DIAGNOSIS — F329 Major depressive disorder, single episode, unspecified: Secondary | ICD-10-CM

## 2013-03-15 DIAGNOSIS — I4892 Unspecified atrial flutter: Secondary | ICD-10-CM

## 2013-03-15 DIAGNOSIS — F1010 Alcohol abuse, uncomplicated: Secondary | ICD-10-CM

## 2013-03-15 DIAGNOSIS — R635 Abnormal weight gain: Secondary | ICD-10-CM

## 2013-03-15 DIAGNOSIS — M545 Low back pain: Secondary | ICD-10-CM

## 2013-03-15 DIAGNOSIS — E042 Nontoxic multinodular goiter: Secondary | ICD-10-CM

## 2013-03-15 LAB — BASIC METABOLIC PANEL
BUN: 8 mg/dL (ref 6–23)
Calcium: 9.6 mg/dL (ref 8.4–10.5)
Creat: 0.72 mg/dL (ref 0.50–1.10)
Glucose, Bld: 73 mg/dL (ref 70–99)

## 2013-03-15 MED ORDER — ESCITALOPRAM OXALATE 10 MG PO TABS: 10.0000 mg | ORAL_TABLET | Freq: Every day | ORAL | Status: DC

## 2013-03-15 NOTE — Progress Notes (Signed)
Pre visit review using our clinic review tool, if applicable. No additional management support is needed unless otherwise documented below in the visit note. 

## 2013-03-15 NOTE — Progress Notes (Signed)
  Subjective:    Patient ID: Kari Hoffman, female    DOB: [birth year], 52 y.o.   MRN: [id number]  HPI  Kari Hoffman is a 52 yr old female who presents today for ED follow up. She was recently hospitalized overnight at [facility] due to finding of new onset Atrial flutter.  Records are reviewed.  She reports feeling well since discharge.    R low back pain- worse when sitting, low back pain radiates around the lower back.    Weight gain-  Weight has been an issue since menopause.  Wt Readings from Last 3 Encounters:  03/15/13 175 lb (79.379 kg)  03/10/13 171 lb 1.3 oz (77.601 kg)  12/15/12 178 lb 0.6 oz (80.758 kg)   Emotional labile- reports that she can have a "strong attitude."  Impacting home/work.  Started around age 64 when she went through menopause. Has trouble falling asleep.  Not a new problem for her.  Sleeps 2 hours to 4-5 hrs a night.  Denies home stress.  Denies panic attacks.   Denies SI/HI.      Review of Systems See HPI  No past medical history on file.  History   Social History  . Marital Status: Married    Spouse Name: N/A    Number of Children: 2  . 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 Smoking status: Former Smoker -- 1.00 packs/day    Quit date: 07/10/2011  . Smokeless tobacco: Not on file     Comment: smoking since 1986  . Alcohol Use: Yes     Comment: socially  . Drug Use: Not on file  . Sexual Activity: Not on file   Other Topics Concern  . Not on file   Social History Narrative   Married 13 yrs (2nd marriage)    Past Surgical History  Procedure Laterality Date  . Anterior cervical discectomy    . Abdominal hysterectomy  1989    left oophorectomy  . Scar tissue removal   2004  . C5- c6, c6- c7 anterior cervical diskectomy and fusion with allograft and anterir plating      Family History  Problem Relation Age of Onset  . Cancer Mother     bone  . Leukemia Father   . Diabetes  Brother     type 2  . Coronary artery disease Neg Hx     No Known Allergies  No current outpatient prescriptions on file prior to visit.   No current facility-administered medications on file prior to visit.    BP 130/78  Pulse 57  Temp(Src) 98.3 F (36.8 C) (Oral)  Resp 16  Ht 5' 6.75" (1.695 m)  Wt 175 lb (79.379 kg)  BMI 27.63 kg/m2  SpO2 99%    .    Objective:   Physical Exam  Constitutional: She is oriented to person, place, and time. She appears well-developed and well-nourished.  Cardiovascular: Normal rate and regular rhythm.   No murmur heard. Pulmonary/Chest: Effort normal and breath sounds normal. No respiratory distress. She has no wheezes. She has no rales. She exhibits no tenderness.  Neurological: She is alert and oriented to person, place, and time.  Skin: Skin is warm and dry.  Psychiatric:  Emotionally labile/tearful          Assessment & Plan:

## 2013-03-15 NOTE — Patient Instructions (Addendum)
Start lexapro 10mg - take 1/2 tablet once daily for 1 week, then increase to full table on week two. Keep upcoming appointment with cardiology. Call imaging in AM to schedule your back x ray. [phone]  Follow up in 1 month.

## 2013-03-16 ENCOUNTER — Encounter: Payer: Self-pay | Admitting: Family

## 2013-03-16 DIAGNOSIS — F329 Major depressive disorder, single episode, unspecified: Secondary | ICD-10-CM | POA: Insufficient documentation

## 2013-03-16 DIAGNOSIS — M545 Low back pain, unspecified: Secondary | ICD-10-CM | POA: Insufficient documentation

## 2013-03-16 DIAGNOSIS — R635 Abnormal weight gain: Secondary | ICD-10-CM | POA: Insufficient documentation

## 2013-03-16 DIAGNOSIS — E042 Nontoxic multinodular goiter: Secondary | ICD-10-CM | POA: Insufficient documentation

## 2013-03-16 NOTE — Assessment & Plan Note (Signed)
Patient denies ETOH abuse.  Reports no recent ETOH use.

## 2013-03-16 NOTE — Assessment & Plan Note (Signed)
Subcentimeter, noted on US performed at [facility].  Check TSH, plan repeat US in 12 months.

## 2013-03-16 NOTE — Assessment & Plan Note (Signed)
Attempt trial of lexapro 10mg .   I instructed pt to start 1/2 tablet once daily for 1 week and then increase to a full tablet once daily on week two as tolerated.  We discussed common side effects such as nausea, drowsiness and weight gain.  Also discussed rare but serious side effect of suicide ideation.  She is instructed to discontinue medication go directly to ED if this occurs.  Pt verbalizes understanding.  Plan follow up in 1 month to evaluate progress.

## 2013-03-16 NOTE — Assessment & Plan Note (Signed)
Obtain x ray of the lumbar spine to evaluate.

## 2013-03-16 NOTE — Assessment & Plan Note (Signed)
Now in NSR, pt instructed to keep upcoming cardiology consultation.

## 2013-03-16 NOTE — Assessment & Plan Note (Signed)
Discussed healthy diet, exercise.  Avoiding alcohol will help decrease her caloric intake. Check TSH.

## 2013-03-23 ENCOUNTER — Encounter: Payer: Self-pay | Admitting: Cardiology

## 2013-03-24 ENCOUNTER — Institutional Professional Consult (permissible substitution): Payer: BC Managed Care – PPO | Admitting: Cardiology

## 2013-04-12 ENCOUNTER — Encounter: Payer: Self-pay | Admitting: Cardiology

## 2013-04-12 DIAGNOSIS — F1011 Alcohol abuse, in remission: Secondary | ICD-10-CM | POA: Insufficient documentation

## 2013-04-12 DIAGNOSIS — R9430 Abnormal result of cardiovascular function study, unspecified: Secondary | ICD-10-CM | POA: Insufficient documentation

## 2013-04-20 ENCOUNTER — Encounter: Payer: Self-pay | Admitting: Cardiology

## 2013-12-13 ENCOUNTER — Ambulatory Visit (INDEPENDENT_AMBULATORY_CARE_PROVIDER_SITE_OTHER): Payer: BC Managed Care – PPO | Admitting: Family

## 2013-12-13 ENCOUNTER — Encounter: Payer: Self-pay | Admitting: Family

## 2013-12-13 VITALS — BP 122/86 | HR 88 | Temp 98.0°F | Resp 16 | Ht 66.25 in | Wt 150.0 lb

## 2013-12-13 DIAGNOSIS — Z0001 Encounter for general adult medical examination with abnormal findings: Secondary | ICD-10-CM | POA: Insufficient documentation

## 2013-12-13 DIAGNOSIS — E042 Nontoxic multinodular goiter: Secondary | ICD-10-CM

## 2013-12-13 DIAGNOSIS — Z0000 Encounter for general adult medical examination without abnormal findings: Secondary | ICD-10-CM | POA: Insufficient documentation

## 2013-12-13 LAB — LIPID PANEL
CHOL/HDL RATIO: 2 ratio
CHOLESTEROL: 203 mg/dL — AB (ref 0–200)
HDL: 100 mg/dL (ref >39)
LDL Cholesterol: 92 mg/dL (ref 0–99)
TRIGLYCERIDES: 55 mg/dL (ref <150)
VLDL: 11 mg/dL (ref 0–40)

## 2013-12-13 LAB — BASIC METABOLIC PANEL WITH GFR
BUN: 5 mg/dL — ABNORMAL LOW (ref 6–23)
CALCIUM: 9.9 mg/dL (ref 8.4–10.5)
CO2: 26 meq/L (ref 19–32)
Chloride: 99 mEq/L (ref 96–112)
Creat: 0.68 mg/dL (ref 0.50–1.10)
GFR, Est African American: >89 mL/min
GFR, Est Non African American: >89 mL/min
GLUCOSE: 79 mg/dL (ref 70–99)
Potassium: 4.3 mEq/L (ref 3.5–5.3)
SODIUM: 135 meq/L (ref 135–145)

## 2013-12-13 NOTE — Assessment & Plan Note (Addendum)
Declines recommended immunization and preventative health screening. Agreeable to lab work necessary for wellness form but not any additional recommended testing.

## 2013-12-13 NOTE — Patient Instructions (Addendum)
Please complete lab work prior to leaving.   

## 2013-12-13 NOTE — Assessment & Plan Note (Signed)
Thyromegaly, declines tsh, declines follow up ultrasound.

## 2013-12-13 NOTE — Progress Notes (Signed)
Subjective:    Patient ID: Kari Hoffman, female    DOB: 04-Jun-1960, 53 y.o.   MRN: [id number]  HPI  Patient presents today to have wellness form completed.  Immunizations:  Declines flu shot.   Diet:  Reports that her diet is healthy.   Exercise:reports that she works out twice a week at the gym Colonoscopy: declines ifob and colo Dexa: declines Pap Smear: she reports last pap 2 years ago per gyn. Mammogram: declines Declines ekg   Review of Systems  Constitutional: Negative for unexpected weight change.  HENT: Negative for rhinorrhea.   Respiratory: Negative for cough and shortness of breath.   Cardiovascular: Negative for chest pain and leg swelling.  Gastrointestinal: Negative for nausea, vomiting, diarrhea and blood in stool.  Genitourinary: Negative for dysuria, frequency and hematuria.  Musculoskeletal: Negative for arthralgias and myalgias.  Skin: Negative for rash.  Neurological: Negative for headaches.  Hematological: Negative for adenopathy.  Psychiatric/Behavioral:       Denies current issues with depression/anxiety   Past Medical History  Diagnosis Date  . H/O alcohol abuse   . Atrial flutter   . Depression   . Multiple thyroid nodules   . Low back pain   . Ejection fraction     History   Social History  . Marital Status: Married    Spouse Name: N/A    Number of Children: 2  . 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 Smoking status: Former Smoker -- 1.00 packs/day    Quit date: 07/10/2011  . Smokeless tobacco: Not on file     Comment: smoking since 1986  . Alcohol Use: Yes     Comment: socially  . Drug Use: Not on file  . Sexual Activity: Not on file   Other Topics Concern  . Not on file   Social History Narrative   Married 13 yrs (2nd marriage)   Works at [employer]    Past Surgical History  Procedure Laterality Date  . Anterior cervical discectomy    . Abdominal hysterectomy   1989    left oophorectomy  . Scar tissue removal   2004  . C5- c6, c6- c7 anterior cervical diskectomy and fusion with allograft and anterir plating      Family History  Problem Relation Age of Onset  . Cancer Mother     bone  . Leukemia Father   . Diabetes Brother     type 2  . Coronary artery disease Neg Hx     No Known Allergies  No current outpatient prescriptions on file prior to visit.   No current facility-administered medications on file prior to visit.    BP 122/86  Pulse 88  Temp(Src) 98 F (36.7 C) (Oral)  Resp 16  Ht 5' 6.25" (1.683 m)  Wt 150 lb 0.6 oz (68.058 kg)  BMI 24.03 kg/m2  SpO2 97%        Objective:   Physical Exam  Physical Exam  Constitutional: She is oriented to person, place, and time. She appears well-developed and well-nourished. No distress.  HENT:  Head: Normocephalic and atraumatic.  Right Ear: Tympanic membrane and ear canal normal.  Left Ear: Tympanic membrane and ear canal normal.  Mouth/Throat: Oropharynx is clear and moist.  Eyes: Pupils are equal, round, and reactive to light. No scleral icterus.  Neck: Normal range of motion. thyromegaly is present.  Cardiovascular: Normal rate and  regular rhythm.   No murmur heard. Pulmonary/Chest: Effort normal and breath sounds normal. No respiratory distress. He has no wheezes. She has no rales. She exhibits no tenderness.  Abdominal: Soft. Bowel sounds are normal. He exhibits no distension and no mass. There is no tenderness. There is no rebound and no guarding.  Musculoskeletal: She exhibits no edema.  Lymphadenopathy:    She has no cervical adenopathy.  Neurological: She is alert and oriented to person, place, and time.  She exhibits normal muscle tone. Coordination normal.  Skin: Skin is warm and dry.  Psychiatric: She has a normal mood and affect. Her behavior is normal. Judgment and thought content normal.  Breasts/pelvic: declines       Assessment & Plan:            Assessment & Plan:

## 2013-12-13 NOTE — Progress Notes (Signed)
Pre visit review using our clinic review tool, if applicable. No additional management support is needed unless otherwise documented below in the visit note. 

## 2013-12-14 ENCOUNTER — Telehealth: Payer: Self-pay | Admitting: Family

## 2013-12-14 NOTE — Telephone Encounter (Signed)
Cholesterol mildly elevated at 203.  Sugar normal. Form complete.

## 2013-12-14 NOTE — Telephone Encounter (Signed)
Form faxed to [phone].  NOtified pt of completion and below labs.

## 2013-12-21 ENCOUNTER — Encounter: Payer: BC Managed Care – PPO | Admitting: Family

## 2014-12-13 ENCOUNTER — Encounter: Payer: Self-pay | Admitting: Internal Medicine

## 2014-12-13 ENCOUNTER — Ambulatory Visit (INDEPENDENT_AMBULATORY_CARE_PROVIDER_SITE_OTHER): Payer: BLUE CROSS/BLUE SHIELD | Admitting: Internal Medicine

## 2014-12-13 VITALS — BP 142/94 | HR 103 | Temp 98.1°F | Ht 66.25 in | Wt 124.4 lb

## 2014-12-13 DIAGNOSIS — IMO0001 Reserved for inherently not codable concepts without codable children: Secondary | ICD-10-CM

## 2014-12-13 DIAGNOSIS — R030 Elevated blood-pressure reading, without diagnosis of hypertension: Secondary | ICD-10-CM

## 2014-12-13 MED ORDER — AMLODIPINE BESYLATE 5 MG PO TABS: 5.0000 mg | ORAL_TABLET | Freq: Every day | ORAL | Status: DC

## 2014-12-13 NOTE — Patient Instructions (Signed)
Take amlodipine 1 tablet daily  Check the  blood pressure every other day, it should gradually get better with medication. Your ideal blood pressure is between 110/65 and  145/85.  if it is consistently higher or lower, let me know  Come back and see your primary provider in 3 weeks  Call anytime if they have side effects such as leg swelling or the blood pressure medication is too strong and is dropping  your blood pressure below 110

## 2014-12-13 NOTE — Progress Notes (Signed)
Subjective:    Patient ID: Kari Hoffman, female    DOB: 05/26/1960, 54 y.o.   MRN: [id number]  DOS:  12/13/2014 Type of visit - description : Acute visit Interval history: The patient liked to check her blood pressure, she had no symptoms she simply liked to do it. 2 days ago was 187/93, yesterday 180/97. No history of previous hypertension .  She's not taking Motrin or similar medication except for occasional BC No increase in salt intake Admits to a lot of stress for at least a week due to switching jobs.    BP Readings from Last 3 Encounters:  12/13/14 142/94  12/13/13 122/86  03/15/13 130/78   Also reports pain at the biceps and calves for the last few days, pain issteady, described as a "tense" feeling. Denies lower extremity edema, no back or neck pain.  Review of Systems No chest pain, difficulty breathing No nausea, vomiting, diarrhea. No headache or dizziness  Past Medical History  Diagnosis Date  . H/O alcohol abuse   . Atrial flutter   . Depression   . Multiple thyroid nodules   . Low back pain   . Ejection fraction     Past Surgical History  Procedure Laterality Date  . Anterior cervical discectomy    . Abdominal hysterectomy  1989    left oophorectomy  . Scar tissue removal   2004  . C5- c6, c6- c7 anterior cervical diskectomy and fusion with allograft and anterir plating      Social History   Social History  . Marital Status: Married    Spouse Name: N/A  . Number of Children: 2  . 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 Smoking status: Former Smoker -- 1.00 packs/day    Quit date: 07/10/2011  . Smokeless tobacco: Not on file     Comment: smoking since 1986  . Alcohol Use: Yes     Comment: socially  . Drug Use: Not on file  . Sexual Activity: Not on file   Other Topics Concern  . Not on file   Social History Narrative   Married 13 yrs (2nd marriage)   Works at [employer]         Medication List       This list is accurate as of: 12/13/14  5:49 PM.  Always use your most recent med list.               amLODipine 5 MG tablet  Commonly known as:  NORVASC  Take 1 tablet (5 mg total) by mouth daily.           Objective:   Physical Exam BP 142/94 mmHg  Pulse 103  Temp(Src) 98.1 F (36.7 C) (Oral)  Ht 5' 6.25" (1.683 m)  Wt 124 lb 6 oz (56.416 kg)  BMI 19.92 kg/m2  SpO2 98% General:   Well developed, well nourished . NAD.  HEENT:  Normocephalic . Face symmetric, atraumatic. Neck: No thyromegaly Lungs:  CTA B Normal respiratory effort, no intercostal retractions, no accessory muscle use. Heart: Slightly tachycardic but regular, no murmur.  No pretibial edema bilaterally  Skin: Not pale. Not jaundice Lower extremities: Calves symmetric, no TTP, good pedal pulses Upper extremities: No TTP at the bicipital areas Neurologic:  alert & oriented X3.  Speech normal, gait appropriate for age and unassisted Psych--  Cognition and judgment appear intact.  Cooperative with normal  attention span and concentration.  Behavior appropriate. No anxious or depressed appearing.      Assessment & Plan:   Elevated BP: Patient with a history of atrial flutter in the past presents with elevated BP for the last 2 days, BP today is 142/94, higher than her usual readings. I recommend EKG and possibly further work but she strongly declines. 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 Plan: Amlodipine, needs to call if anxiety becomes a major problem, monitor BPs, low-salt diet Follow-up with PCP to 3 weeks  Pain at the upper - lower extremities, etiology unclear, if not better will recommend to check CKs

## 2014-12-13 NOTE — Progress Notes (Signed)
Pre visit review using our clinic review tool, if applicable. No additional management support is needed unless otherwise documented below in the visit note. 

## 2015-05-17 ENCOUNTER — Telehealth: Payer: Self-pay | Admitting: Behavioral Health

## 2015-05-17 NOTE — Telephone Encounter (Signed)
Assessed the following gaps in care: Mammogram Pap Colonoscopy Flu  Attempted to reach the patient to schedule an annual physical with PCP. Patient unavailable at this time. Left message for a callback.

## 2015-06-23 ENCOUNTER — Telehealth: Payer: Self-pay | Admitting: Family

## 2015-06-23 NOTE — Telephone Encounter (Signed)
LM for pt to call and schedule flu shot or [facility] records & [facility]

## 2017-08-04 ENCOUNTER — Encounter (HOSPITAL_COMMUNITY): Payer: Self-pay | Admitting: Emergency Medicine

## 2017-08-04 ENCOUNTER — Inpatient Hospital Stay (HOSPITAL_COMMUNITY)
Admission: EM | Admit: 2017-08-04 | Discharge: 2017-08-08 | DRG: 073 | Disposition: A | Discharge status: Home / Self Care | Payer: 59 | Attending: Family Medicine | Admitting: Family Medicine

## 2017-08-04 DIAGNOSIS — G608 Other hereditary and idiopathic neuropathies: Secondary | ICD-10-CM | POA: Diagnosis not present

## 2017-08-04 DIAGNOSIS — B9620 Unspecified Escherichia coli [E. coli] as the cause of diseases classified elsewhere: Secondary | ICD-10-CM | POA: Diagnosis present

## 2017-08-04 DIAGNOSIS — R400 Somnolence: Secondary | ICD-10-CM

## 2017-08-04 DIAGNOSIS — F1010 Alcohol abuse, uncomplicated: Secondary | ICD-10-CM | POA: Diagnosis present

## 2017-08-04 DIAGNOSIS — R4182 Altered mental status, unspecified: Secondary | ICD-10-CM | POA: Diagnosis present

## 2017-08-04 DIAGNOSIS — Z79899 Other long term (current) drug therapy: Secondary | ICD-10-CM

## 2017-08-04 DIAGNOSIS — Z90721 Acquired absence of ovaries, unilateral: Secondary | ICD-10-CM

## 2017-08-04 DIAGNOSIS — N390 Urinary tract infection, site not specified: Secondary | ICD-10-CM | POA: Diagnosis present

## 2017-08-04 DIAGNOSIS — Z90710 Acquired absence of both cervix and uterus: Secondary | ICD-10-CM

## 2017-08-04 DIAGNOSIS — K2970 Gastritis, unspecified, without bleeding: Secondary | ICD-10-CM | POA: Diagnosis not present

## 2017-08-04 DIAGNOSIS — A4151 Sepsis due to Escherichia coli [E. coli]: Secondary | ICD-10-CM | POA: Diagnosis present

## 2017-08-04 DIAGNOSIS — F329 Major depressive disorder, single episode, unspecified: Secondary | ICD-10-CM | POA: Diagnosis present

## 2017-08-04 DIAGNOSIS — Z981 Arthrodesis status: Secondary | ICD-10-CM

## 2017-08-04 DIAGNOSIS — R404 Transient alteration of awareness: Secondary | ICD-10-CM

## 2017-08-04 DIAGNOSIS — E860 Dehydration: Secondary | ICD-10-CM | POA: Diagnosis present

## 2017-08-04 DIAGNOSIS — N3000 Acute cystitis without hematuria: Secondary | ICD-10-CM

## 2017-08-04 DIAGNOSIS — G9340 Encephalopathy, unspecified: Secondary | ICD-10-CM | POA: Diagnosis present

## 2017-08-04 DIAGNOSIS — I10 Essential (primary) hypertension: Secondary | ICD-10-CM | POA: Diagnosis present

## 2017-08-04 DIAGNOSIS — G9349 Other encephalopathy: Secondary | ICD-10-CM | POA: Diagnosis present

## 2017-08-04 DIAGNOSIS — E876 Hypokalemia: Secondary | ICD-10-CM | POA: Diagnosis present

## 2017-08-04 DIAGNOSIS — Z87891 Personal history of nicotine dependence: Secondary | ICD-10-CM

## 2017-08-04 DIAGNOSIS — M545 Low back pain: Secondary | ICD-10-CM | POA: Diagnosis present

## 2017-08-04 DIAGNOSIS — Z716 Tobacco abuse counseling: Secondary | ICD-10-CM

## 2017-08-04 DIAGNOSIS — R112 Nausea with vomiting, unspecified: Secondary | ICD-10-CM | POA: Diagnosis not present

## 2017-08-04 DIAGNOSIS — E042 Nontoxic multinodular goiter: Secondary | ICD-10-CM | POA: Diagnosis present

## 2017-08-04 NOTE — ED Triage Notes (Signed)
Patient here from home via EMS with complaints of nausea, vomiting, abdominal upper left pain x 1 month. CBG 114.

## 2017-08-05 ENCOUNTER — Inpatient Hospital Stay (HOSPITAL_COMMUNITY): Admit: 2017-08-05 | Payer: 59

## 2017-08-05 ENCOUNTER — Emergency Department (HOSPITAL_COMMUNITY): Payer: 59

## 2017-08-05 ENCOUNTER — Other Ambulatory Visit: Payer: Self-pay

## 2017-08-05 ENCOUNTER — Encounter (HOSPITAL_COMMUNITY): Payer: Self-pay | Admitting: Radiology

## 2017-08-05 ENCOUNTER — Inpatient Hospital Stay (HOSPITAL_COMMUNITY): Payer: 59

## 2017-08-05 DIAGNOSIS — R4182 Altered mental status, unspecified: Secondary | ICD-10-CM | POA: Diagnosis present

## 2017-08-05 DIAGNOSIS — Z90721 Acquired absence of ovaries, unilateral: Secondary | ICD-10-CM | POA: Diagnosis not present

## 2017-08-05 DIAGNOSIS — N3000 Acute cystitis without hematuria: Secondary | ICD-10-CM | POA: Diagnosis not present

## 2017-08-05 DIAGNOSIS — I10 Essential (primary) hypertension: Secondary | ICD-10-CM | POA: Diagnosis present

## 2017-08-05 DIAGNOSIS — G9340 Encephalopathy, unspecified: Secondary | ICD-10-CM | POA: Diagnosis present

## 2017-08-05 DIAGNOSIS — F329 Major depressive disorder, single episode, unspecified: Secondary | ICD-10-CM | POA: Diagnosis present

## 2017-08-05 DIAGNOSIS — G9349 Other encephalopathy: Secondary | ICD-10-CM | POA: Diagnosis not present

## 2017-08-05 DIAGNOSIS — R109 Unspecified abdominal pain: Secondary | ICD-10-CM | POA: Diagnosis not present

## 2017-08-05 DIAGNOSIS — R4020 Unspecified coma: Secondary | ICD-10-CM | POA: Diagnosis not present

## 2017-08-05 DIAGNOSIS — B9620 Unspecified Escherichia coli [E. coli] as the cause of diseases classified elsewhere: Secondary | ICD-10-CM | POA: Diagnosis present

## 2017-08-05 DIAGNOSIS — Z981 Arthrodesis status: Secondary | ICD-10-CM | POA: Diagnosis not present

## 2017-08-05 DIAGNOSIS — R400 Somnolence: Secondary | ICD-10-CM | POA: Diagnosis not present

## 2017-08-05 DIAGNOSIS — Z90710 Acquired absence of both cervix and uterus: Secondary | ICD-10-CM | POA: Diagnosis not present

## 2017-08-05 DIAGNOSIS — F1010 Alcohol abuse, uncomplicated: Secondary | ICD-10-CM | POA: Diagnosis not present

## 2017-08-05 DIAGNOSIS — Z716 Tobacco abuse counseling: Secondary | ICD-10-CM | POA: Diagnosis not present

## 2017-08-05 DIAGNOSIS — A4151 Sepsis due to Escherichia coli [E. coli]: Secondary | ICD-10-CM | POA: Diagnosis not present

## 2017-08-05 DIAGNOSIS — R1110 Vomiting, unspecified: Secondary | ICD-10-CM | POA: Diagnosis not present

## 2017-08-05 DIAGNOSIS — M545 Low back pain: Secondary | ICD-10-CM | POA: Diagnosis present

## 2017-08-05 DIAGNOSIS — N390 Urinary tract infection, site not specified: Secondary | ICD-10-CM | POA: Diagnosis not present

## 2017-08-05 DIAGNOSIS — E042 Nontoxic multinodular goiter: Secondary | ICD-10-CM | POA: Diagnosis present

## 2017-08-05 DIAGNOSIS — R05 Cough: Secondary | ICD-10-CM | POA: Diagnosis not present

## 2017-08-05 DIAGNOSIS — Z87891 Personal history of nicotine dependence: Secondary | ICD-10-CM | POA: Diagnosis not present

## 2017-08-05 DIAGNOSIS — E876 Hypokalemia: Secondary | ICD-10-CM | POA: Diagnosis present

## 2017-08-05 DIAGNOSIS — E860 Dehydration: Secondary | ICD-10-CM | POA: Diagnosis present

## 2017-08-05 DIAGNOSIS — Z79899 Other long term (current) drug therapy: Secondary | ICD-10-CM | POA: Diagnosis not present

## 2017-08-05 DIAGNOSIS — G608 Other hereditary and idiopathic neuropathies: Secondary | ICD-10-CM | POA: Diagnosis not present

## 2017-08-05 DIAGNOSIS — R404 Transient alteration of awareness: Secondary | ICD-10-CM | POA: Diagnosis present

## 2017-08-05 LAB — URINALYSIS, ROUTINE W REFLEX MICROSCOPIC
BILIRUBIN URINE: NEGATIVE
Glucose, UA: NEGATIVE mg/dL
KETONES UR: 5 mg/dL — AB
Nitrite: NEGATIVE
PROTEIN: NEGATIVE mg/dL
Specific Gravity, Urine: 1.02 (ref 1.005–1.030)
pH: 5 (ref 5.0–8.0)

## 2017-08-05 LAB — BLOOD GAS, VENOUS
ACID-BASE DEFICIT: 3.4 mmol/L — AB (ref 0.0–2.0)
Bicarbonate: 21.3 mmol/L (ref 20.0–28.0)
FIO2: 0.21
O2 SAT: 25.4 %
PATIENT TEMPERATURE: 98.6
pCO2, Ven: 39.3 mmHg — ABNORMAL LOW (ref 44.0–60.0)
pH, Ven: 7.353 (ref 7.250–7.430)

## 2017-08-05 LAB — CBC
HEMATOCRIT: 40.3 % (ref 36.0–46.0)
HEMATOCRIT: 33.3 % — AB (ref 36.0–46.0)
Hemoglobin: 14.2 g/dL (ref 12.0–15.0)
Hemoglobin: 11.4 g/dL — ABNORMAL LOW (ref 12.0–15.0)
MCH: 34.1 pg — AB (ref 26.0–34.0)
MCH: 33.3 pg (ref 26.0–34.0)
MCHC: 35.2 g/dL (ref 30.0–36.0)
MCHC: 34.2 g/dL (ref 30.0–36.0)
MCV: 96.6 fL (ref 78.0–100.0)
MCV: 97.4 fL (ref 78.0–100.0)
PLATELETS: 160 10*3/uL (ref 150–400)
Platelets: 163 10*3/uL (ref 150–400)
RBC: 4.17 MIL/uL (ref 3.87–5.11)
RBC: 3.42 MIL/uL — ABNORMAL LOW (ref 3.87–5.11)
RDW: 12.5 % (ref 11.5–15.5)
RDW: 12.6 % (ref 11.5–15.5)
WBC: 7.8 10*3/uL (ref 4.0–10.5)
WBC: 8.1 10*3/uL (ref 4.0–10.5)

## 2017-08-05 LAB — LACTIC ACID, PLASMA
LACTIC ACID, VENOUS: 1.9 mmol/L (ref 0.5–1.9)
LACTIC ACID, VENOUS: 1.2 mmol/L (ref 0.5–1.9)

## 2017-08-05 LAB — COMPREHENSIVE METABOLIC PANEL
ALT: 48 U/L (ref 14–54)
ANION GAP: 14 (ref 5–15)
AST: 43 U/L — ABNORMAL HIGH (ref 15–41)
Albumin: 3 g/dL — ABNORMAL LOW (ref 3.5–5.0)
Alkaline Phosphatase: 81 U/L (ref 38–126)
BUN: 26 mg/dL — ABNORMAL HIGH (ref 6–20)
CHLORIDE: 101 mmol/L (ref 101–111)
CO2: 23 mmol/L (ref 22–32)
CREATININE: 0.9 mg/dL (ref 0.44–1.00)
Calcium: 9.1 mg/dL (ref 8.9–10.3)
GFR calc non Af Amer: >60 mL/min (ref >60)
Glucose, Bld: 123 mg/dL — ABNORMAL HIGH (ref 65–99)
POTASSIUM: 3.1 mmol/L — AB (ref 3.5–5.1)
SODIUM: 138 mmol/L (ref 135–145)
Total Bilirubin: 1.3 mg/dL — ABNORMAL HIGH (ref 0.3–1.2)
Total Protein: 6.8 g/dL (ref 6.5–8.1)

## 2017-08-05 LAB — BLOOD GAS, ARTERIAL
Acid-base deficit: 5.8 mmol/L — ABNORMAL HIGH (ref 0.0–2.0)
BICARBONATE: 17.1 mmol/L — AB (ref 20.0–28.0)
Drawn by: 232811
FIO2: 21
O2 SAT: 97.5 %
PATIENT TEMPERATURE: 97
PCO2 ART: 25.7 mmHg — AB (ref 32.0–48.0)
PO2 ART: 109 mmHg — AB (ref 83.0–108.0)
pH, Arterial: 7.434 (ref 7.350–7.450)

## 2017-08-05 LAB — TROPONIN I: Troponin I: <0.03 ng/mL (ref <0.03)

## 2017-08-05 LAB — CREATININE, SERUM
Creatinine, Ser: 0.7 mg/dL (ref 0.44–1.00)
GFR calc non Af Amer: >60 mL/min (ref >60)

## 2017-08-05 LAB — SALICYLATE LEVEL: Salicylate Lvl: <7 mg/dL (ref 2.8–30.0)

## 2017-08-05 LAB — GLUCOSE, CAPILLARY: Glucose-Capillary: 125 mg/dL — ABNORMAL HIGH (ref 65–99)

## 2017-08-05 LAB — AMMONIA: Ammonia: 13 umol/L (ref 9–35)

## 2017-08-05 LAB — LIPASE, BLOOD: LIPASE: 56 U/L — AB (ref 11–51)

## 2017-08-05 LAB — RAPID URINE DRUG SCREEN, HOSP PERFORMED
Amphetamines: NOT DETECTED
Barbiturates: NOT DETECTED
Benzodiazepines: NOT DETECTED
Cocaine: NOT DETECTED
OPIATES: NOT DETECTED
Tetrahydrocannabinol: NOT DETECTED

## 2017-08-05 LAB — MAGNESIUM: Magnesium: 2.1 mg/dL (ref 1.7–2.4)

## 2017-08-05 LAB — ETHANOL

## 2017-08-05 LAB — CK: CK TOTAL: 107 U/L (ref 38–234)

## 2017-08-05 LAB — TSH: TSH: 2.076 u[IU]/mL (ref 0.350–4.500)

## 2017-08-05 LAB — ACETAMINOPHEN LEVEL: Acetaminophen (Tylenol), Serum: <10 ug/mL — ABNORMAL LOW (ref 10–30)

## 2017-08-05 MED ORDER — SODIUM CHLORIDE 0.9 % IV SOLN
1.0000 g | INTRAVENOUS | Status: DC
  Administered 2017-08-06 – 2017-08-07 (×2): 1 g via INTRAVENOUS
  Filled 2017-08-05: qty 1
  Filled 2017-08-05: qty 10

## 2017-08-05 MED ORDER — IOPAMIDOL (ISOVUE-300) INJECTION 61%
INTRAVENOUS | Status: AC
  Administered 2017-08-05: 80 mL via INTRAVENOUS
  Filled 2017-08-05: qty 100

## 2017-08-05 MED ORDER — POTASSIUM CHLORIDE 2 MEQ/ML IV SOLN
INTRAVENOUS | Status: AC
  Administered 2017-08-05 (×2): via INTRAVENOUS
  Filled 2017-08-05 (×4): qty 1000

## 2017-08-05 MED ORDER — THIAMINE HCL 100 MG/ML IJ SOLN
250.0000 mg | Freq: Every day | INTRAMUSCULAR | Status: DC
  Administered 2017-08-07 – 2017-08-08 (×2): 250 mg via INTRAVENOUS
  Filled 2017-08-05 (×2): qty 4

## 2017-08-05 MED ORDER — CEFTRIAXONE SODIUM 1 G IJ SOLR
1.0000 g | Freq: Once | INTRAMUSCULAR | Status: AC
  Administered 2017-08-05: 1 g via INTRAVENOUS
  Filled 2017-08-05: qty 10

## 2017-08-05 MED ORDER — THIAMINE HCL 100 MG/ML IJ SOLN: 250.0000 mg | Freq: Every day | INTRAMUSCULAR | Status: DC

## 2017-08-05 MED ORDER — SODIUM CHLORIDE 0.9 % IV BOLUS: 500.0000 mL | Freq: Once | INTRAVENOUS | Status: DC

## 2017-08-05 MED ORDER — POTASSIUM CHLORIDE 2 MEQ/ML IV SOLN
INTRAVENOUS | Status: DC
  Filled 2017-08-05: qty 1000

## 2017-08-05 MED ORDER — SODIUM CHLORIDE 0.9 % IV BOLUS (SEPSIS)
1000.0000 mL | Freq: Once | INTRAVENOUS | Status: AC
  Administered 2017-08-05: 1000 mL via INTRAVENOUS

## 2017-08-05 MED ORDER — THIAMINE HCL 100 MG/ML IJ SOLN: 500.0000 mg | Freq: Three times a day (TID) | INTRAVENOUS | Status: DC

## 2017-08-05 MED ORDER — SODIUM CHLORIDE 0.9 % IV SOLN
1000.0000 mL | INTRAVENOUS | Status: DC
  Administered 2017-08-05: 1000 mL via INTRAVENOUS

## 2017-08-05 MED ORDER — THIAMINE HCL 100 MG/ML IJ SOLN
500.0000 mg | Freq: Three times a day (TID) | INTRAVENOUS | Status: AC
  Administered 2017-08-05 – 2017-08-06 (×6): 500 mg via INTRAVENOUS
  Filled 2017-08-05: qty 2
  Filled 2017-08-05 (×5): qty 5

## 2017-08-05 MED ORDER — IOPAMIDOL (ISOVUE-300) INJECTION 61%
100.0000 mL | Freq: Once | INTRAVENOUS | Status: AC | PRN
  Administered 2017-08-05: 80 mL via INTRAVENOUS

## 2017-08-05 MED ORDER — ENOXAPARIN SODIUM 40 MG/0.4ML ~~LOC~~ SOLN
40.0000 mg | SUBCUTANEOUS | Status: DC
  Administered 2017-08-05 – 2017-08-07 (×3): 40 mg via SUBCUTANEOUS
  Filled 2017-08-05 (×3): qty 0.4

## 2017-08-05 NOTE — ED Notes (Addendum)
Unable to complete orthostatic vitals at this time due to safety issues.

## 2017-08-05 NOTE — ED Provider Notes (Signed)
[facility]-EMERGENCY DEPT Provider Note   CSN: [id number] Arrival date & time: 08/04/17  1703     History   Chief Complaint Chief Complaint  Patient presents with  . Nausea  . Emesis  . Abdominal Pain    HPI Kari Hoffman is a 57 y.o. female.  Per the patient's husband, the patient has gotten progressively weaker over the las 2 weeks. She has been walking off balance and has started falling in the last couple of days. She is eating and drinking a lot less than her normal. NO fever at any point that the husband is aware of. He reports she complains of abdominal pain and has had nausea with vomiting. No hematemesis. Husband states she is a daily drinker but has not been drinking over the last 2 weeks due to current symptoms. No difficulty speaking. No facial asymmetry. Husband reports she is confused and altered, also progressive x 2 weeks.   The history is provided by the patient and the spouse.  Emesis   Associated symptoms include abdominal pain.  Abdominal Pain   Associated symptoms include vomiting.    Past Medical History:  Diagnosis Date  . Atrial flutter (HCC)   . Depression   . Ejection fraction   . H/O alcohol abuse   . Low back pain   . Multiple thyroid nodules     Patient Active Problem List   Diagnosis Date Noted  . Routine general medical examination at a health care facility 12/13/2013  . H/O alcohol abuse   . Ejection fraction   . Depression 03/16/2013  . Weight gain 03/16/2013  . Multiple thyroid nodules 03/16/2013  . Low back pain 03/16/2013  . Atrial flutter (HCC) 03/10/2013  . Alcohol abuse 03/10/2013  . Screening for hyperlipidemia 12/15/2012  . TOBACCO ABUSE 02/08/2010    Past Surgical History:  Procedure Laterality Date  . ABDOMINAL HYSTERECTOMY  1989   left oophorectomy  . ANTERIOR CERVICAL DISCECTOMY    . C5- C6, C6- C7 anterior cervical diskectomy and fusion with allograft and anterir plating    . scar tissue  removal   2004     OB History   None      Home Medications    Prior to Admission medications   Medication Sig Start Date End Date Taking? Authorizing Provider  amLODipine (NORVASC) 5 MG tablet Take 1 tablet (5 mg total) by mouth daily. 12/13/14   Wanda Plump, MD    Family History Family History  Problem Relation Age of Onset  . Cancer Mother        bone  . Leukemia Father   . Diabetes Brother        type 2  . Coronary artery disease Neg Hx     Social History Social History   Tobacco Use  . Smoking status: Former Smoker    Packs/day: 1.00    Last attempt to quit: 07/10/2011    Years since quitting: 6.0  . Smokeless tobacco: Never Used  . Tobacco comment: smoking since 1986  Substance Use Topics  . Alcohol use: Yes    Comment: socially  . Drug use: Not on file     Allergies   Patient has no known allergies.   Review of Systems Review of Systems  Unable to perform ROS: Mental status change  Gastrointestinal: Positive for abdominal pain and vomiting.     Physical Exam Updated Vital Signs BP 116/87 (BP Location: Right Arm)   Pulse 95  Resp 14   SpO2 98%   Physical Exam  Constitutional: She appears well-developed and well-nourished. She appears ill. No distress.  HENT:  Head: Normocephalic.  Eyes: No scleral icterus.  Neck: Normal range of motion. Neck supple.  Cardiovascular: Normal rate and regular rhythm.  No murmur heard. Pulmonary/Chest: Effort normal and breath sounds normal. She has no wheezes. She has no rhonchi. She has no rales. She exhibits no tenderness.  Poor inspiratory effort  Abdominal: Soft. Bowel sounds are normal. There is no tenderness. There is no rebound and no guarding.  Musculoskeletal: Normal range of motion.  Neurological: She is alert. No cranial nerve deficit.  Skin: Skin is warm and dry. No rash noted. There is pallor.  Psychiatric: She has a normal mood and affect.     ED Treatments / Results  Labs (all labs  ordered are listed, but only abnormal results are displayed) Labs Reviewed  LIPASE, BLOOD  COMPREHENSIVE METABOLIC PANEL  CBC  URINALYSIS, ROUTINE W REFLEX MICROSCOPIC  AMMONIA  ETHANOL  RAPID URINE DRUG SCREEN, HOSP PERFORMED  LACTIC ACID, PLASMA  LACTIC ACID, PLASMA    EKG EKG Interpretation  Date/Time:  Tuesday August 05 2017 00:39:31 EDT Ventricular Rate:  84 PR Interval:    QRS Duration: 94 QT Interval:  394 QTC Calculation: 466 R Axis:   82 Text Interpretation:  Sinus rhythm Biatrial enlargement Borderline T abnormalities, anterior leads Interpretation limited secondary to artifact No previous ECGs available Confirmed by Zadie Rhine ([id number]) on 08/05/2017 12:51:16 AM   Radiology No results found.  Procedures Procedures (including critical care time)  Medications Ordered in ED Medications  sodium chloride 0.9 % bolus 1,000 mL (has no administration in time range)    Followed by  sodium chloride 0.9 % bolus 1,000 mL (has no administration in time range)    Followed by  0.9 %  sodium chloride infusion (has no administration in time range)     Initial Impression / Assessment and Plan / ED Course  I have reviewed the triage vital signs and the nursing notes.  Pertinent labs & imaging results that were available during my care of the patient were reviewed by me and considered in my medical decision making (see chart for details).  Clinical Course as of Aug 06 54  Mon Aug 04, 2017  2357 11:55 pm - patient not in the room to be seen    [SU]    Clinical Course User Index [SU] Elpidio Anis, PA-C    Patient presents with husband who brings her in for evaluation of progressive AMS x 2 weeks that includes walking off balance, falls, somnolence, confusion. No fever. Per husband, she has been vomiting with nonbleedy emesis. No diarrhea. C/O abdominal pain  Work up is essentially unable to identify source of change in mental status. The patient is somnolent here  but arousable. She does not appear in distress. She reports abdominal pain, but also acknowledges "yes" to all ROS questions. Unable to reliably contribute to history.  The patient is seen by Dr. Bebe Shaggy. Mental status unchanged after IVF's/rehydration, and observation in the ED. Urine is positive for a probable early UTI but not felt to be contributory of AMS. Negative lactic acid. Rocephin provided for UTI.   Discussed with Dr. Selena Batten for admission for further evaluation of AMS. Differential includes overdose (Tylenol and Aspirin levels pending) vs Alcoholic dementia vs unidentified cause AMS.     Final Clinical Impressions(s) / ED Diagnoses   Final diagnoses:  None  1. AMS 2. Somnolence 3. Nausea and vomiting 4. UTI  ED Discharge Orders    None       Elpidio AnisUpstill, Brittnae Aschenbrenner, PA-C 08/05/17 0531    Zadie RhineWickline, Donald, MD 08/05/17 [phone]

## 2017-08-05 NOTE — Evaluation (Signed)
Clinical/Bedside Swallow Evaluation Patient Details  Name: Kari Hoffman MRN: [id number] Date of Birth: October 08, 1960  Today's Date: 08/05/2017 Time: SLP Start Time (ACUTE ONLY): 1859 SLP Stop Time (ACUTE ONLY): 1917 SLP Time Calculation (min) (ACUTE ONLY): 18 min  Past Medical History:  Past Medical History:  Diagnosis Date  . Atrial flutter (HCC)   . Depression   . Ejection fraction   . H/O alcohol abuse   . Low back pain   . Multiple thyroid nodules    Past Surgical History:  Past Surgical History:  Procedure Laterality Date  . ABDOMINAL HYSTERECTOMY  1989   left oophorectomy  . ANTERIOR CERVICAL DISCECTOMY    . C5- C6, C6- C7 anterior cervical diskectomy and fusion with allograft and anterir plating    . scar tissue removal   2004   HPI:  57 yo female adm to [facility] with AMS.  Pt PMH + ETOH use,smoking,  thryoid nodules, afib, ACDF C5-C6, C6-C7 01/2001 by Dr Wynetta Emery.  Pt with abdomen pain upon admit - CT abdomen negative for acute change, and stable adenoma.  CXR negative.  Pt does have a cough currently.    Assessment / Plan / Recommendation Clinical Impression  Limited po observed due to pt's complaint of abdomen pain - No focal CN deficits impacting nerves responsible for swallowing.  Pt with secretions retained posterior oral cavity which she cleared with use of toothette/tissues per SLP instruction.  Minimal intake of thin and puree observed with pt able to self feed.  Swallow was timely without airway compromise.   Pt denies h/o dysphagia s/p ACDF or recent dysphagia symptoms.    Since pt has congested cough, h/o ETOH use that may impact swallowing and SLP unable to give [facility] 3-ounce water test (due to pt nausea)- will follow up x1 to assure tolerance. Anticipate pt will have functional oropharyngeal swallow.     Recommend clear liquids and for family to bring pt's dentures to allow her to consume solids.  Pt agreeable to plan.  SLP Visit Diagnosis: Dysphagia, unspecified  (R13.10)    Aspiration Risk  Mild aspiration risk    Diet Recommendation Thin liquid(clears)   Liquid Administration via: Straw Medication Administration: (as tolerated) Supervision: Patient able to self feed Compensations: Slow rate;Small sips/bites Postural Changes: Seated upright at 90 degrees;Remain upright for at least 30 minutes after po intake    Other  Recommendations Oral Care Recommendations: Oral care BID   Follow up Recommendations (tbd)      Frequency and Duration min 1 x/week  1 week       Prognosis Prognosis for Safe Diet Advancement: Good      Swallow Study   General Date of Onset: 08/05/17 HPI: 57 yo female adm to [facility] with AMS.  Pt PMH + ETOH use,smoking,  thryoid nodules, afib, ACDF C5-C6, C6-C7 01/2001 by Dr Wynetta Emery.  Pt with abdomen pain upon admit - CT abdomen negative for acute change, and stable adenoma.  CXR negative.  Pt does have a cough currently.  Type of Study: Bedside Swallow Evaluation Diet Prior to this Study: NPO Temperature Spikes Noted: No Respiratory Status: Room air History of Recent Intubation: No Behavior/Cognition: Alert;Cooperative;Confused(confused to place and situation) Oral Cavity Assessment: Other (comment)(viscous secretions on tongue - posterior oral cavity  - provided pt with tissues and toothette and she removed it herself) Oral Care Completed by SLP: Yes(assisted pt to provide to herself) Oral Cavity - Dentition: Dentures, not available Vision: Functional for self-feeding Self-Feeding Abilities: Able  to feed self Patient Positioning: Upright in bed Baseline Vocal Quality: Normal Volitional Cough: Strong Volitional Swallow: Able to elicit    Oral/Motor/Sensory Function Overall Oral Motor/Sensory Function: Within functional limits   Ice Chips Ice chips: Not tested   Thin Liquid Thin Liquid: Within functional limits Presentation: Self Fed;Straw    Nectar Thick Nectar Thick Liquid: Not tested   Honey Thick Honey Thick  Liquid: Not tested   Puree Puree: Within functional limits Presentation: Self Fed;Spoon   Solid   GO   Solid: Not tested Other Comments: pt reports she always uses dentures for intake         Chales Hoffman, Kari Grandison Ann 08/05/2017,7:33 PM

## 2017-08-05 NOTE — ED Notes (Signed)
ED TO INPATIENT HANDOFF REPORT  Name/Age/Gender Kari Hoffman 57 y.o. female  Code Status   Home/SNF/Other Home  Chief Complaint N V   Abdominal Pain  Level of Care/Admitting Diagnosis ED Disposition    ED Disposition Condition [facility] Area: [place] [100102]  Level of Care: Telemetry [5]  Admit to tele based on following criteria: Other see comments  Comments: altered mental status  Diagnosis: Acute encephalopathy [366294]  Admitting Physician: Elodia Florence [phone]  Attending Physician: Cephus Slater, A CALDWELL [phone]  Estimated length of stay: past midnight tomorrow  Certification:: I certify this patient will need inpatient services for at least 2 midnights  PT Class (Do Not Modify): Inpatient [101]  PT Acc Code (Do Not Modify): Private [1]       Medical History Past Medical History:  Diagnosis Date  . Atrial flutter ([place])   . Depression   . Ejection fraction   . H/O alcohol abuse   . Low back pain   . Multiple thyroid nodules     Allergies No Known Allergies  IV Location/Drains/Wounds Patient Lines/Drains/Airways Status   Active Line/Drains/Airways    None          Labs/Imaging Results for orders placed or performed during the hospital encounter of 08/04/17 (from the past 48 hour(s))  Lipase, blood     Status: Abnormal   Collection Time: 08/05/17  1:15 AM  Result Value Ref Range   Lipase 56 (H) 11 - 51 U/L    Comment: Performed at [facility], [place] [address] [place], [place] [address]  Comprehensive metabolic panel     Status: Abnormal   Collection Time: 08/05/17  1:15 AM  Result Value Ref Range   Sodium 138 135 - 145 mmol/L   Potassium 3.1 (L) 3.5 - 5.1 mmol/L   Chloride 101 101 - 111 mmol/L   CO2 23 22 - 32 mmol/L   Glucose, Bld 123 (H) 65 - 99 mg/dL   BUN 26 (H) 6 - 20 mg/dL   Creatinine, Ser 0.90 0.44 - 1.00 mg/dL   Calcium 9.1 8.9 - 10.3 mg/dL   Total Protein 6.8  6.5 - 8.1 g/dL   Albumin 3.0 (L) 3.5 - 5.0 g/dL   AST 43 (H) 15 - 41 U/L   ALT 48 14 - 54 U/L   Alkaline Phosphatase 81 38 - 126 U/L   Total Bilirubin 1.3 (H) 0.3 - 1.2 mg/dL   GFR calc non Af Amer >60 >60 mL/min   GFR calc Af Amer >60 >60 mL/min    Comment: (NOTE) The eGFR has been calculated using the CKD EPI equation. This calculation has not been validated in all clinical situations. eGFR's persistently <60 mL/min signify possible Chronic Kidney Disease.    Anion gap 14 5 - 15    Comment: Performed at [facility], [place] [address], [place], [place] [address]  CBC     Status: Abnormal   Collection Time: 08/05/17  1:15 AM  Result Value Ref Range   WBC 7.8 4.0 - 10.5 K/uL   RBC 4.17 3.87 - 5.11 MIL/uL   Hemoglobin 14.2 12.0 - 15.0 g/dL   HCT 40.3 36.0 - 46.0 %   MCV 96.6 78.0 - 100.0 fL   MCH 34.1 (H) 26.0 - 34.0 pg   MCHC 35.2 30.0 - 36.0 g/dL   RDW 12.5 11.5 - 15.5 %   Platelets 163 150 - 400 K/uL  Comment: Performed at [facility], [place] [address], [place], [place] [address]  Ethanol     Status: None   Collection Time: 08/05/17  1:15 AM  Result Value Ref Range   Alcohol, Ethyl (B) <10 <10 mg/dL    Comment:        LOWEST DETECTABLE LIMIT FOR SERUM ALCOHOL IS 10 mg/dL FOR MEDICAL PURPOSES ONLY Performed at [place] [address], [place], [place] [address]   Lactic acid, plasma     Status: None   Collection Time: 08/05/17  1:15 AM  Result Value Ref Range   Lactic Acid, Venous 1.9 0.5 - 1.9 mmol/L    Comment: Performed at [facility], [place] [address] [place], [place] [address]  Ammonia     Status: None   Collection Time: 08/05/17  1:16 AM  Result Value Ref Range   Ammonia 13 9 - 35 umol/L    Comment: Performed at [facility], [place] [address] [place], [place] [address]  Urinalysis, Routine w reflex microscopic     Status: Abnormal   Collection Time: 08/05/17  2:43  AM  Result Value Ref Range   Color, Urine AMBER (A) YELLOW    Comment: BIOCHEMICALS MAY BE AFFECTED BY COLOR   APPearance HAZY (A) CLEAR   Specific Gravity, Urine 1.020 1.005 - 1.030   pH 5.0 5.0 - 8.0   Glucose, UA NEGATIVE NEGATIVE mg/dL   Hgb urine dipstick MODERATE (A) NEGATIVE   Bilirubin Urine NEGATIVE NEGATIVE   Ketones, ur 5 (A) NEGATIVE mg/dL   Protein, ur NEGATIVE NEGATIVE mg/dL   Nitrite NEGATIVE NEGATIVE   Leukocytes, UA SMALL (A) NEGATIVE   RBC / HPF 6-30 0 - 5 RBC/hpf   WBC, UA 6-30 0 - 5 WBC/hpf   Bacteria, UA MANY (A) NONE SEEN   Squamous Epithelial / LPF 0-5 (A) NONE SEEN   Hyaline Casts, UA PRESENT     Comment: Performed at [facility], [place] [address], [place], [place] [address]  Rapid urine drug screen (hospital performed)     Status: None   Collection Time: 08/05/17  2:43 AM  Result Value Ref Range   Opiates NONE DETECTED NONE DETECTED   Cocaine NONE DETECTED NONE DETECTED   Benzodiazepines NONE DETECTED NONE DETECTED   Amphetamines NONE DETECTED NONE DETECTED   Tetrahydrocannabinol NONE DETECTED NONE DETECTED   Barbiturates NONE DETECTED NONE DETECTED    Comment: (NOTE) DRUG SCREEN FOR MEDICAL PURPOSES ONLY.  IF CONFIRMATION IS NEEDED FOR ANY PURPOSE, NOTIFY LAB WITHIN 5 DAYS. LOWEST DETECTABLE LIMITS FOR URINE DRUG SCREEN Drug Class                     Cutoff (ng/mL) Amphetamine and metabolites    1000 Barbiturate and metabolites    200 Benzodiazepine                 017 Tricyclics and metabolites     300 Opiates and metabolites        300 Cocaine and metabolites        300 THC                            50 Performed at [facility], [place] [address] [place], [place] [address]   Lactic acid, plasma     Status: None   Collection Time: 08/05/17  3:11 AM  Result Value  Ref Range   Lactic Acid, Venous 1.2 0.5 - 1.9 mmol/L    Comment: Performed at [facility], [place] [address] [place],  [place] [address]  CK     Status: None   Collection Time: 08/05/17  3:11 AM  Result Value Ref Range   Total CK 107 38 - 234 U/L    Comment: Performed at [facility], [place] [address], [place], [place] [address]  Troponin I     Status: None   Collection Time: 08/05/17  3:11 AM  Result Value Ref Range   Troponin I <0.03 <0.03 ng/mL    Comment: Performed at [facility], [place] [address], [place], [place] [address]  Magnesium     Status: None   Collection Time: 08/05/17  3:11 AM  Result Value Ref Range   Magnesium 2.1 1.7 - 2.4 mg/dL    Comment: Performed at [facility], [place] [address], [place], [place] [address]  Blood gas, arterial ([facility] & [facility] ONLY)     Status: Abnormal   Collection Time: 08/05/17  4:10 AM  Result Value Ref Range   FIO2 21.00    Delivery systems ROOM AIR    pH, Arterial 7.434 7.350 - 7.450   pCO2 arterial 25.7 (L) 32.0 - 48.0 mmHg   pO2, Arterial 109 (H) 83.0 - 108.0 mmHg   Bicarbonate 17.1 (L) 20.0 - 28.0 mmol/L   Acid-base deficit 5.8 (H) 0.0 - 2.0 mmol/L   O2 Saturation 97.5 %   Patient temperature 97.0    Collection site RIGHT BRACHIAL    Drawn by [id number]    Sample type ARTERIAL    Allens test (pass/fail) PASS PASS    Comment: Performed at [facility], [place] [address] [place], [place] [address]  Salicylate level     Status: None   Collection Time: 08/05/17  4:39 AM  Result Value Ref Range   Salicylate Lvl <2.8 2.8 - 30.0 mg/dL    Comment: Performed at [facility], [place] [address] [place], [place] [address]  Acetaminophen level     Status: Abnormal   Collection Time: 08/05/17  4:39 AM  Result Value Ref Range   Acetaminophen (Tylenol), Serum <10 (L) 10 - 30 ug/mL    Comment:        THERAPEUTIC CONCENTRATIONS VARY SIGNIFICANTLY. A RANGE OF 10-30 ug/mL MAY BE AN EFFECTIVE CONCENTRATION FOR MANY PATIENTS. HOWEVER, SOME ARE BEST TREATED AT CONCENTRATIONS OUTSIDE  THIS RANGE. ACETAMINOPHEN CONCENTRATIONS >150 ug/mL AT 4 HOURS AFTER INGESTION AND >50 ug/mL AT 12 HOURS AFTER INGESTION ARE OFTEN ASSOCIATED WITH TOXIC REACTIONS. Performed at [facility], [place] [address], [place], [place] [address]   Blood gas, venous     Status: Abnormal   Collection Time: 08/05/17  8:28 AM  Result Value Ref Range   FIO2 0.21    pH, Ven 7.353 7.250 - 7.430   pCO2, Ven 39.3 (L) 44.0 - 60.0 mmHg   pO2, Ven BELOW REPORTABLE RANGE 32.0 - 45.0 mmHg    Comment: CRITICAL RESULT CALLED TO, READ BACK BY AND VERIFIED WITH: Cherie Lasalle AT 0858 BY BFRETWELL RRT ON 08/05/17    Bicarbonate 21.3 20.0 - 28.0 mmol/L   Acid-base deficit 3.4 (H) 0.0 - 2.0 mmol/L   O2 Saturation 25.4 %   Patient temperature 98.6     Comment: Performed at [facility], [place] [address], [place], [place] [address]   Dg Chest 2  View  Result Date: 08/05/2017 CLINICAL DATA:  Acute onset of cough.  Altered mental status. EXAM: CHEST - 2 VIEW COMPARISON:  None. FINDINGS: The lungs are well-aerated and clear. There is no evidence of focal opacification, pleural effusion or pneumothorax. Bilateral nipple shadows are noted. The heart is normal in size; the mediastinal contour is within normal limits. No acute osseous abnormalities are seen. Cervical spinal fusion hardware is noted. IMPRESSION: No acute cardiopulmonary process seen. Electronically Signed   By: Garald Balding M.D.   On: 08/05/2017 01:31   Ct Head Wo Contrast  Result Date: 08/05/2017 CLINICAL DATA:  Altered LOC EXAM: CT HEAD WITHOUT CONTRAST TECHNIQUE: Contiguous axial images were obtained from the base of the skull through the vertex without intravenous contrast. COMPARISON:  None. FINDINGS: Brain: No evidence of acute infarction, hemorrhage, hydrocephalus, extra-axial collection or mass lesion/mass effect. Mild atrophy. Minimal small vessel ischemic changes of the white matter. Vascular: No hyperdense  vessels.  Carotid vascular calcification Skull: Normal. Negative for fracture or focal lesion. Sinuses/Orbits: Mucosal thickening and fluid in the left maxillary sinus. No acute orbital abnormality Other: None IMPRESSION: 1. No CT evidence for acute intracranial abnormality. 2. Mild atrophy and minimal small vessel ischemic changes of white matter Electronically Signed   By: Donavan Foil M.D.   On: 08/05/2017 01:51   Ct Abdomen Pelvis W Contrast  Result Date: 08/05/2017 CLINICAL DATA:  Left upper quadrant abdominal pain, nausea, vomiting. EXAM: CT ABDOMEN AND PELVIS WITH CONTRAST TECHNIQUE: Multidetector CT imaging of the abdomen and pelvis was performed using the standard protocol following bolus administration of intravenous contrast. CONTRAST:  60m ISOVUE-300 IOPAMIDOL (ISOVUE-300) INJECTION 61% COMPARISON:  CT scan of December 24, 2009. FINDINGS: Lower chest: Stable 6 mm nodule seen in right posterior costophrenic sulcus. No other abnormality seen in visualized lung bases. Hepatobiliary: No focal liver abnormality is seen. No gallstones, gallbladder wall thickening, or biliary dilatation. Pancreas: Unremarkable. No pancreatic ductal dilatation or surrounding inflammatory changes. Spleen: Normal in size without focal abnormality. Adrenals/Urinary Tract: Stable left adrenal adenoma. Right adrenal gland appears normal. No hydronephrosis or renal obstruction is noted. No renal or ureteral calculi are noted. Urinary bladder is unremarkable. Stomach/Bowel: Stomach is within normal limits. Appendix appears normal. No evidence of bowel wall thickening, distention, or inflammatory changes. Vascular/Lymphatic: Aortic atherosclerosis. No enlarged abdominal or pelvic lymph nodes. Reproductive: Uterus and bilateral adnexa are unremarkable. Other: No abdominal wall hernia or abnormality. No abdominopelvic ascites. Musculoskeletal: No acute or significant osseous findings. IMPRESSION: Stable left adrenal adenoma. No  acute abnormality seen in the abdomen or pelvis. Aortic Atherosclerosis (ICD10-I70.0). Electronically Signed   By: JMarijo Conception M.D.   On: 08/05/2017 09:35    Pending Labs Unresulted Labs (From admission, onward)   Start     Ordered   08/05/17 0713  Culture, Urine  Add-on,   R     08/05/17 04650  Signed and Held  HIV antibody (Routine Testing)  Once,   R     Signed and Held   Signed and Held  CBC  (enoxaparin (LOVENOX)    CrCl >/= 30 ml/min)  Once,   R    Comments:  Baseline for enoxaparin therapy IF NOT ALREADY DRAWN.  Notify MD if PLT < 100 K.    Signed and Held   Signed and Held  Creatinine, serum  (enoxaparin (LOVENOX)    CrCl >/= 30 ml/min)  Once,   R    Comments:  Baseline for enoxaparin therapy IF  NOT ALREADY DRAWN.    Signed and Held   Signed and Held  Creatinine, serum  (enoxaparin (LOVENOX)    CrCl >/= 30 ml/min)  Weekly,   R    Comments:  while on enoxaparin therapy    Signed and Held   Signed and Held  TSH  Add-on,   R     Signed and Held   Signed and Held  Comprehensive metabolic panel  Tomorrow morning,   R     Signed and Held   Signed and Held  CBC  Tomorrow morning,   R     Signed and Held      Vitals/Pain Today's Vitals   08/05/17 0430 08/05/17 0649 08/05/17 0923 08/05/17 1148  BP:  98/79 115/74 94/69  Pulse: 68 62 60 (!) 53  Resp: (!) '28 18 16 10  '$ Temp:      TempSrc:      SpO2: 100% 100% 98% 100%  Weight:      Height:      PainSc:        Isolation Precautions No active isolations  Medications Medications  thiamine '500mg'$  in normal saline (18m) IVPB (0 mg Intravenous Stopped 08/05/17 0900)  thiamine (B-1) injection 250 mg (has no administration in time range)  lactated ringers 1,000 mL with potassium chloride 20 mEq infusion ( Intravenous New Bag/Given 08/05/17 1016)  sodium chloride 0.9 % bolus 1,000 mL (0 mLs Intravenous Stopped 08/05/17 0303)    Followed by  sodium chloride 0.9 % bolus 1,000 mL (0 mLs Intravenous Stopped 08/05/17 0238)   cefTRIAXone (ROCEPHIN) 1 g in sodium chloride 0.9 % 100 mL IVPB (0 g Intravenous Stopped 08/05/17 0747)  iopamidol (ISOVUE-300) 61 % injection 100 mL (80 mLs Intravenous Contrast Given 08/05/17 0912)    Mobility non-ambulatory

## 2017-08-05 NOTE — ED Provider Notes (Signed)
Patient seen/examined in the Emergency Department in conjunction with Midlevel Provider Upstill Patient presents with abdominal pain and vomiting for several weeks.  Husband reports she is been feeling fatigued and reporting pain. Exam : Patient somnolent but arousable.  She has diffuse abdominal tenderness. Plan: Patient with worsening symptoms of the past several weeks.  Unclear cause thus far of her somnolence.  Workup pending at this time If she continues to have abdominal tenderness she may need CT imaging of her abdomen   Zadie RhineWickline, Loran Auguste, MD 08/05/17 [phone]

## 2017-08-05 NOTE — H&P (Signed)
History and Physical    Kari Hoffman ZOX:[id number] DOB: 08/30/1960 DOA: 08/04/2017  PCP: Kari Craze, NP Patient coming from: home  I have personally briefly reviewed patient's old medical records in [facility]  Chief Complaint: lower extremity swelling, SOB  HPI: Kari Hoffman is Kari Hoffman 57 y.o. female with medical history significant of atrial flutter, alcohol abuse, depression presenting with altered mental status and gait abnormalities concerning for Wernicke's encephalopathy.   History is limited due to the patient's altered mental status.  History is obtained with assistance primarily from the patient's husband.  He notes that everything started in late February and early March.  At that time he noticed increase in his loss of balance.  This has been progressive since that time.  He notes that she has been steadily declining over the past few weeks.  On Sunday, he found her sitting on the floor in the living room.  She suspects she may have fallen.  He got her up to the bathroom and then back to the bed.  Yesterday he came home and she was leaning against the time and was incontinent of stool and urine.  At that time he decided to bring her in.  He notes that she has been sleeping and out of it for the past 1-2 weeks.  She comes and goes.  At times she is awake and able to interact appropriately.  He notes that she is persistently confused.  He denies noting any fevers, cough.  She has complained of some abdominal pain recently.  She drinks at least 4-5 beers [name] day, but she has not had any drink in the past 3-4 days.    ED Course: Labs, EKG, imaging.  Admit to medicine for AMS.  Review of Systems: As per HPI otherwise 10 point review of systems negative.   Past Medical History:  Diagnosis Date  . Atrial flutter ([facility])   . Depression   . Ejection fraction   . H/O alcohol abuse   . Low back pain   . Multiple thyroid nodules     Past Surgical History:  Procedure Laterality  Date  . ABDOMINAL HYSTERECTOMY  1989   left oophorectomy  . ANTERIOR CERVICAL DISCECTOMY    . C5- C6, C6- C7 anterior cervical diskectomy and fusion with allograft and anterir plating    . scar tissue removal   2004     reports that she quit smoking about 6 years ago. She smoked 1.00 pack per day. She has never used smokeless tobacco. She reports that she drinks alcohol. Her drug history is not on file.  No Known Allergies  Family History  Problem Relation Age of Onset  . Cancer Mother        bone  . Leukemia Father   . Diabetes Brother        type 2  . Coronary artery disease Neg Hx    Prior to Admission medications   Medication Sig Start Date End Date Taking? Authorizing Provider  amLODipine (NORVASC) 5 MG tablet Take 1 tablet (5 mg total) by mouth daily. Patient not taking: Reported on 08/05/2017 12/13/14   Kari Plump, MD    Physical Exam: Vitals:   08/05/17 0330 08/05/17 0358 08/05/17 0430 08/05/17 0649  BP:  109/73  98/79  Pulse: 70 68 68 62  Resp: 17 18 (!) 28 18  Temp:      TempSrc:      SpO2: 99% 96% 100% 100%  Weight:  Height:        Constitutional: NAD, calm, somnolent Vitals:   08/05/17 0330 08/05/17 0358 08/05/17 0430 08/05/17 0649  BP:  109/73  98/79  Pulse: 70 68 68 62  Resp: 17 18 (!) 28 18  Temp:      TempSrc:      SpO2: 99% 96% 100% 100%  Weight:      Height:       Eyes: PERRL, lids and conjunctivae normal ENMT: Mucous membranes are moist. Posterior pharynx clear of any exudate or lesions.Normal dentition.  Neck: normal, supple, no masses, no thyromegaly Respiratory: clear to auscultation bilaterally, no wheezing, no crackles. Normal respiratory effort. No accessory muscle use.  Cardiovascular: Regular rate and rhythm, no murmurs / rubs / gallops. No extremity edema. 2+ pedal pulses. No carotid bruits.  Abdomen: diffuse ttp, no masses palpated. No hepatosplenomegaly. Bowel sounds positive.  Musculoskeletal: no clubbing / cyanosis. No joint  deformity upper and lower extremities. Good ROM, no contractures. Normal muscle tone.  Skin: no rashes, lesions, ulcers. No induration Neurologic: CN 2-12 grossly intact. Moving all extremities.  Exam limited as only intermittently following commands.  Intermittently falling asleep, occasionally waking up spontaneously and interactive.  Psychiatric: Disoriented  Labs on Admission: I have personally reviewed following labs and imaging studies  CBC: Recent Labs  Lab 08/05/17 0115  WBC 7.8  HGB 14.2  HCT 40.3  MCV 96.6  PLT 163   Basic Metabolic Panel: Recent Labs  Lab 08/05/17 0115 08/05/17 0311  NA 138  --   K 3.1*  --   CL 101  --   CO2 23  --   GLUCOSE 123*  --   BUN 26*  --   CREATININE 0.90  --   CALCIUM 9.1  --   MG  --  2.1   GFR: Estimated Creatinine Clearance: 59.9 mL/min (by C-G formula based on SCr of 0.9 mg/dL). Liver Function Tests: Recent Labs  Lab 08/05/17 0115  AST 43*  ALT 48  ALKPHOS 81  BILITOT 1.3*  PROT 6.8  ALBUMIN 3.0*   Recent Labs  Lab 08/05/17 0115  LIPASE 56*   Recent Labs  Lab 08/05/17 0116  AMMONIA 13   Coagulation Profile: No results for input(s): INR, PROTIME in the last 168 hours. Cardiac Enzymes: Recent Labs  Lab 08/05/17 0311  CKTOTAL 107  TROPONINI <0.03   BNP (last 3 results) No results for input(s): PROBNP in the last 8760 hours. HbA1C: No results for input(s): HGBA1C in the last 72 hours. CBG: No results for input(s): GLUCAP in the last 168 hours. Lipid Profile: No results for input(s): CHOL, HDL, LDLCALC, TRIG, CHOLHDL, LDLDIRECT in the last 72 hours. Thyroid Function Tests: No results for input(s): TSH, T4TOTAL, FREET4, T3FREE, THYROIDAB in the last 72 hours. Anemia Panel: No results for input(s): VITAMINB12, FOLATE, FERRITIN, TIBC, IRON, RETICCTPCT in the last 72 hours. Urine analysis:    Component Value Date/Time   COLORURINE AMBER ([name]) 08/05/2017 0243   APPEARANCEUR HAZY ([name]) 08/05/2017 0243    LABSPEC 1.020 08/05/2017 0243   PHURINE 5.0 08/05/2017 0243   GLUCOSEU NEGATIVE 08/05/2017 0243   HGBUR MODERATE ([name]) 08/05/2017 0243   BILIRUBINUR NEGATIVE 08/05/2017 0243   KETONESUR 5 (Stehanie Ekstrom) 08/05/2017 0243   PROTEINUR NEGATIVE 08/05/2017 0243   UROBILINOGEN 0.2 12/24/2009 0142   NITRITE NEGATIVE 08/05/2017 0243   LEUKOCYTESUR SMALL (Joyclyn Plazola) 08/05/2017 0243    Radiological Exams on Admission: Dg Chest 2 View  Result Date: 08/05/2017 CLINICAL DATA:  Acute onset of cough.  Altered mental status. EXAM: CHEST - 2 VIEW COMPARISON:  None. FINDINGS: The lungs are well-aerated and clear. There is no evidence of focal opacification, pleural effusion or pneumothorax. Bilateral nipple shadows are noted. The heart is normal in size; the mediastinal contour is within normal limits. No acute osseous abnormalities are seen. Cervical spinal fusion hardware is noted. IMPRESSION: No acute cardiopulmonary process seen. Electronically Signed   By: Roanna Raider M.D.   On: 08/05/2017 01:31   Ct Head Wo Contrast  Result Date: 08/05/2017 CLINICAL DATA:  Altered LOC EXAM: CT HEAD WITHOUT CONTRAST TECHNIQUE: Contiguous axial images were obtained from the base of the skull through the vertex without intravenous contrast. COMPARISON:  None. FINDINGS: Brain: No evidence of acute infarction, hemorrhage, hydrocephalus, extra-axial collection or mass lesion/mass effect. Mild atrophy. Minimal small vessel ischemic changes of the white matter. Vascular: No hyperdense vessels.  Carotid vascular calcification Skull: Normal. Negative for fracture or focal lesion. Sinuses/Orbits: Mucosal thickening and fluid in the left maxillary sinus. No acute orbital abnormality Other: None IMPRESSION: 1. No CT evidence for acute intracranial abnormality. 2. Mild atrophy and minimal small vessel ischemic changes of white matter Electronically Signed   By: Jasmine Pang M.D.   On: 08/05/2017 01:51    EKG: Independently reviewed. Sinus rhythm, appears  similar to priors.  Assessment/Plan Active Problems:   Altered mental status   Acute encephalopathy  Acute Encephalopathy:  In the setting of gait abnormality, confusion, and etoh use, concern for wernicke's encephalopathy.  Also on ddx is 2/2 UTI with UA with WBC, RBC's, many bacteria.   Negative utox, ASA, salicylates, CT head without acute abnormality, abg without hypercarbia.  Waxing and waning on my exam, occasionally waking up and answering questions appropriately, but then falling back to sleep. Thiamine 500 mg TID x 2 days then 250 daily x 5 Ceftriaxone Follow urine culture Follow TSH NPO until speech evaluation  Abdominal Pain:  Mildly elevated AST, lipase, and bili.  discomfort on exam.  Follow CT abdomen/pelvis.   Hypokalemia: follow mg.  K in fluids.  Etoh Abuse:  Last drink 3-4 days ago.  Will place on CIWA.  Hold off on meds for now.  History of Calliope Delangel flutter: not on anticoagulation.  Follow on telemetry.    Tobacco abuse: encourage cessation when able  HTN: not taking meds  DVT prophylaxis: lovenox  Code Status: full  Family Communication: husband at bedside  Disposition Plan: pending improvement  Consults called: none  Admission status: obs    Lacretia Nicks MD [facility] Pager [phone]  If 7PM-7AM, please contact night-coverage [email or web address] Password [facility]  08/05/2017, 8:29 AM

## 2017-08-05 NOTE — Significant Event (Signed)
Rapid Response Event Note  Overview: Time Called: 0900 Arrival Time: 0902 Event Type: Hypotension  Initial Focused Assessment: pt lying in bed, RN concerned due to temp of 97.3 rectally as well as hypotensive, BP in 80's.  Pt intermittently confused and somnolent--baseline for the last few weeks, being worked up for Wernicke's encephalopathy.  Pt arouses, answers questions appropriately, skin cool to touch--room also cool.  Increased room temp, applied warm blankets, initiated fluid bolus of 500cc Normal Saline.  Recheck of vital signs noted in flow sheet.   Interventions: 500 cc bolus  Plan of Care (if not transferred): recheck temp and vs  Event Summary: pt stayed in the room   at      at          [facility]

## 2017-08-06 DIAGNOSIS — F1010 Alcohol abuse, uncomplicated: Secondary | ICD-10-CM

## 2017-08-06 DIAGNOSIS — N3000 Acute cystitis without hematuria: Secondary | ICD-10-CM

## 2017-08-06 DIAGNOSIS — N390 Urinary tract infection, site not specified: Secondary | ICD-10-CM

## 2017-08-06 DIAGNOSIS — G9340 Encephalopathy, unspecified: Secondary | ICD-10-CM

## 2017-08-06 LAB — CBC
HCT: 31.7 % — ABNORMAL LOW (ref 36.0–46.0)
Hemoglobin: 10.7 g/dL — ABNORMAL LOW (ref 12.0–15.0)
MCH: 33.4 pg (ref 26.0–34.0)
MCHC: 33.8 g/dL (ref 30.0–36.0)
MCV: 99.1 fL (ref 78.0–100.0)
PLATELETS: 160 10*3/uL (ref 150–400)
RBC: 3.2 MIL/uL — AB (ref 3.87–5.11)
RDW: 12.7 % (ref 11.5–15.5)
WBC: 7.2 10*3/uL (ref 4.0–10.5)

## 2017-08-06 LAB — COMPREHENSIVE METABOLIC PANEL
ALK PHOS: 54 U/L (ref 38–126)
ALT: 42 U/L (ref 14–54)
ANION GAP: 9 (ref 5–15)
AST: 43 U/L — ABNORMAL HIGH (ref 15–41)
Albumin: 2.1 g/dL — ABNORMAL LOW (ref 3.5–5.0)
BUN: 13 mg/dL (ref 6–20)
CALCIUM: 7.7 mg/dL — AB (ref 8.9–10.3)
CO2: 23 mmol/L (ref 22–32)
CREATININE: 0.68 mg/dL (ref 0.44–1.00)
Chloride: 108 mmol/L (ref 101–111)
Glucose, Bld: 94 mg/dL (ref 65–99)
Potassium: 2.9 mmol/L — ABNORMAL LOW (ref 3.5–5.1)
SODIUM: 140 mmol/L (ref 135–145)
TOTAL PROTEIN: 4.8 g/dL — AB (ref 6.5–8.1)
Total Bilirubin: 0.4 mg/dL (ref 0.3–1.2)

## 2017-08-06 LAB — HIV ANTIBODY (ROUTINE TESTING W REFLEX): HIV Screen 4th Generation wRfx: NONREACTIVE

## 2017-08-06 MED ORDER — ADULT MULTIVITAMIN W/MINERALS CH
1.0000 | ORAL_TABLET | Freq: Every day | ORAL | Status: DC
  Administered 2017-08-06 – 2017-08-08 (×3): 1 via ORAL
  Filled 2017-08-06 (×3): qty 1

## 2017-08-06 MED ORDER — SODIUM CHLORIDE 0.9 % IV BOLUS
1000.0000 mL | Freq: Once | INTRAVENOUS | Status: AC
  Administered 2017-08-06: 1000 mL via INTRAVENOUS

## 2017-08-06 MED ORDER — LORAZEPAM 2 MG/ML IJ SOLN
1.0000 mg | Freq: Four times a day (QID) | INTRAMUSCULAR | Status: DC | PRN
Start: 2017-08-06 — End: 2017-08-08

## 2017-08-06 MED ORDER — POTASSIUM CHLORIDE 10 MEQ/100ML IV SOLN
10.0000 meq | INTRAVENOUS | Status: AC
  Administered 2017-08-06 (×3): 10 meq via INTRAVENOUS
  Filled 2017-08-06 (×3): qty 100

## 2017-08-06 MED ORDER — LORAZEPAM 1 MG PO TABS
1.0000 mg | ORAL_TABLET | Freq: Four times a day (QID) | ORAL | Status: DC | PRN
Start: 2017-08-06 — End: 2017-08-08

## 2017-08-06 MED ORDER — FOLIC ACID 1 MG PO TABS
1.0000 mg | ORAL_TABLET | Freq: Every day | ORAL | Status: DC
  Administered 2017-08-06 – 2017-08-08 (×3): 1 mg via ORAL
  Filled 2017-08-06 (×3): qty 1

## 2017-08-06 NOTE — Progress Notes (Signed)
  Speech Language Pathology Treatment: Dysphagia  Patient Details Name: Kari Hoffman MRN: [id number] DOB: Jan 28, 1961 Today's Date: 08/06/2017 Time: [id number] SLP Time Calculation (min) (ACUTE ONLY): 15 min  Assessment / Plan / Recommendation Clinical Impression  Pt fully alert today and spouse brought in her dentures.  She did not pass Yale 3 ounce water test due to consuming approximately 2 ounces followed by immediate coughing.  She also demonstrated delayed cough after cracker intake.  Again denying dysphagia prior to admission = however pt admits to having issues with reflux lately and taking a reflux medication.  Recommend advance diet to regular/thin liquids.  Will follow up x1 to assure tolerance given failure on 3 ounce water test and coughing and with intake. Informed pt and family to recommendations to eat slowly and monitor closely for tolerance.     HPI HPI: 57 [facility].  Pt PMH + ETOH use,smoking,  thryoid nodules, afib, ACDF C5-C6, C6-C7 01/2001 by Dr Wynetta Emeryram.  Pt with abdomen pain upon admit - CT abdomen negative for acute change, and stable adenoma.  CXR negative.  Pt does have a cough currently.       SLP Plan  Continue with current plan of care       Recommendations  Diet recommendations: Regular;Thin liquid Liquids provided via: Cup;Straw Supervision: Patient able to self feed Compensations: Minimize environmental distractions;Slow rate;Small sips/bites Postural Changes and/or Swallow Maneuvers: Seated upright 90 degrees;Upright 30-60 min after meal                Oral Care Recommendations: Oral care BID SLP Visit Diagnosis: Dysphagia, unspecified (R13.10) Plan: Continue with current plan of care       GO               Donavan Burnetamara Dayami Taitt, MS [facility] SLP [phone]  [name], Antonis Lor Ann 08/06/2017, 9:54 AM
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
## 2017-08-06 NOTE — Progress Notes (Addendum)
[facility]  PROGRESS NOTE  Kari KendallDonna R Hoffman WUJ:[id number] DOB: [birth year] DOA: 08/04/2017 PCP: Sandford Craze'Sullivan, [name], NP   Brief HPI:   57 y.o. female with medical history significant of atrial flutter, alcohol abuse, depression presenting with altered mental status and gait abnormalities concerning for Wernicke's encephalopathy.     Subjective   Patient seen and examined, is much more alert today.  Answering questions appropriately.   Assessment/Plan:     1.  Acute encephalopathy-improving likely concerning for worsening sensory neuropathy patient started on thiamine.  Also has UTI with urine culture growing gram-negative rods.  Currently patient is on IV ceftriaxone.  Negative urine toxicology, salicylates.  CT head showed no acute abnormality.  TSH 2.076   2.  Abdominal pain-resolved, CT abdomen/pelvis shows no acute abnormality.  3. Hypokalemia-potassium is 2.9 replace potassium with IV KCl 10 meq x 3 follow BMP in a.m..  4.  History of atrial flutter-patient is not on anticoagulation, likely from gait instability, alcohol abuse.  5.  Alcohol abuse-patient has been drinking alcohol for past more than 20 years, no signs and symptoms of alcohol withdrawal this time.  Continue CIWA protocol.  6.  UTI-urine culture growing gram-negative rods, continue with ceftriaxone.  Follow urine culture results.    DVT prophylaxis: Lovenox   Code Status: Full code  Family Communication: discussed with patient's husband at bedside  Disposition Plan: likely home when medically ready for discharge   Consultants:  None  Procedures:  None   Antibiotics:   Anti-infectives (From admission, onward)   Start     Dose/Rate Route Frequency Ordered Stop   08/06/17 0600  cefTRIAXone (ROCEPHIN) 1 g in sodium chloride 0.9 % 100 mL IVPB     1 g 200 mL/hr over 30 Minutes Intravenous Every 24 hours 08/05/17 1729 08/12/17 0559   08/05/17 0500  cefTRIAXone (ROCEPHIN) 1 g in sodium chloride  0.9 % 100 mL IVPB     1 g 200 mL/hr over 30 Minutes Intravenous  Once 08/05/17 0458 08/05/17 0747       Objective   Vitals:   08/06/17 0509 08/06/17 0525 08/06/17 0638 08/06/17 1132  BP: (!) 72/50 (!) 79/44 (!) 80/59 92/70  Pulse:   74 80  Resp:      Temp:  98.5 F (36.9 C)    TempSrc:  Rectal    SpO2:      Weight:      Height:        Intake/Output Summary (Last 24 hours) at 08/06/2017 1401 Last data filed at 08/06/2017 0600 Gross per 24 hour  Intake 2285.73 ml  Output 645 ml  Net 1640.73 ml   Filed Weights   08/05/17 0210 08/06/17 0502  Weight: 54.4 kg (120 lb) 58 kg (127 lb 13.9 oz)     Physical Examination:  Physical Exam: Eyes: No icterus, extraocular muscles intact  Mouth: Oral mucosa is moist, no lesions on palate,  Neck: Supple, no deformities, masses, or tenderness Lungs: Normal respiratory effort, bilateral clear to auscultation, no crackles or wheezes.  Heart: Regular rate and rhythm, S1 and S2 normal, no murmurs, rubs auscultated Abdomen: BS normoactive,soft,nondistended,non-tender to palpation,no organomegaly Extremities: No pretibial edema, no erythema, no cyanosis, no clubbing Neuro : Alert and oriented to place and person, No focal deficits       Data Reviewed: I have personally reviewed following labs and imaging studies  CBG: Recent Labs  Lab 08/05/17 2051  GLUCAP 125*    CBC: Recent Labs  Lab 08/05/17 0115  08/05/17 1611 08/06/17 0648  WBC 7.8 8.1 7.2  HGB 14.2 11.4* 10.7*  HCT 40.3 33.3* 31.7*  MCV 96.6 97.4 99.1  PLT 163 160 160    Basic Metabolic Panel: Recent Labs  Lab 08/05/17 0115 08/05/17 0311 08/05/17 1611 08/06/17 0648  NA 138  --   --  140  K 3.1*  --   --  2.9*  CL 101  --   --  108  CO2 23  --   --  23  GLUCOSE 123*  --   --  94  BUN 26*  --   --  13  CREATININE 0.90  --  0.70 0.68  CALCIUM 9.1  --   --  7.7*  MG  --  2.1  --   --     Recent Results (from the past 240 hour(s))  Culture, Urine      Status: Abnormal (Preliminary result)   Collection Time: 08/05/17  2:43 AM  Result Value Ref Range Status   Specimen Description   Final    URINE, CLEAN CATCH Performed at [facility], [address] [place], [place] [address]    Special Requests   Final    NONE Performed at [facility], [address], [place], [place] [address]    Culture (A)  Final    >=100,000 COLONIES/mL ESCHERICHIA COLI SUSCEPTIBILITIES TO FOLLOW Performed at [facility], [address], [place], [place] [address]    Report Status PENDING  Incomplete     Liver Function Tests: Recent Labs  Lab 08/05/17 0115 08/06/17 0648  AST 43* 43*  ALT 48 42  ALKPHOS 81 54  BILITOT 1.3* 0.4  PROT 6.8 4.8*  ALBUMIN 3.0* 2.1*   Recent Labs  Lab 08/05/17 0115  LIPASE 56*   Recent Labs  Lab 08/05/17 0116  AMMONIA 13    Cardiac Enzymes: Recent Labs  Lab 08/05/17 0311  CKTOTAL 107  TROPONINI <0.03   BNP (last 3 results) No results for input(s): BNP in the last 8760 hours.  ProBNP (last 3 results) No results for input(s): PROBNP in the last 8760 hours.    Studies: Dg Chest 2 View  Result Date: 08/05/2017 CLINICAL DATA:  Acute onset of cough.  Altered mental status. EXAM: CHEST - 2 VIEW COMPARISON:  None. FINDINGS: The lungs are well-aerated and clear. There is no evidence of focal opacification, pleural effusion or pneumothorax. Bilateral nipple shadows are noted. The heart is normal in size; the mediastinal contour is within normal limits. No acute osseous abnormalities are seen. Cervical spinal fusion hardware is noted. IMPRESSION: No acute cardiopulmonary process seen. Electronically Signed   By: Roanna Raider M.D.   On: 08/05/2017 01:31   Ct Head Wo Contrast  Result Date: 08/05/2017 CLINICAL DATA:  Altered LOC EXAM: CT HEAD WITHOUT CONTRAST TECHNIQUE: Contiguous axial images were obtained from the base of the skull through the vertex without  intravenous contrast. COMPARISON:  None. FINDINGS: Brain: No evidence of acute infarction, hemorrhage, hydrocephalus, extra-axial collection or mass lesion/mass effect. Mild atrophy. Minimal small vessel ischemic changes of the white matter. Vascular: No hyperdense vessels.  Carotid vascular calcification Skull: Normal. Negative for fracture or focal lesion. Sinuses/Orbits: Mucosal thickening and fluid in the left maxillary sinus. No acute orbital abnormality Other: None IMPRESSION: 1. No CT evidence for acute intracranial abnormality. 2. Mild atrophy and minimal small vessel ischemic changes of white matter Electronically Signed   By: Adrian Prows.D.  On: 08/05/2017 01:51   Ct Abdomen Pelvis W Contrast  Result Date: 08/05/2017 CLINICAL DATA:  Left upper quadrant abdominal pain, nausea, vomiting. EXAM: CT ABDOMEN AND PELVIS WITH CONTRAST TECHNIQUE: Multidetector CT imaging of the abdomen and pelvis was performed using the standard protocol following bolus administration of intravenous contrast. CONTRAST:  80mL ISOVUE-300 IOPAMIDOL (ISOVUE-300) INJECTION 61% COMPARISON:  CT scan of December 24, 2009. FINDINGS: Lower chest: Stable 6 mm nodule seen in right posterior costophrenic sulcus. No other abnormality seen in visualized lung bases. Hepatobiliary: No focal liver abnormality is seen. No gallstones, gallbladder wall thickening, or biliary dilatation. Pancreas: Unremarkable. No pancreatic ductal dilatation or surrounding inflammatory changes. Spleen: Normal in size without focal abnormality. Adrenals/Urinary Tract: Stable left adrenal adenoma. Right adrenal gland appears normal. No hydronephrosis or renal obstruction is noted. No renal or ureteral calculi are noted. Urinary bladder is unremarkable. Stomach/Bowel: Stomach is within normal limits. Appendix appears normal. No evidence of bowel wall thickening, distention, or inflammatory changes. Vascular/Lymphatic: Aortic atherosclerosis. No enlarged abdominal  or pelvic lymph nodes. Reproductive: Uterus and bilateral adnexa are unremarkable. Other: No abdominal wall hernia or abnormality. No abdominopelvic ascites. Musculoskeletal: No acute or significant osseous findings. IMPRESSION: Stable left adrenal adenoma. No acute abnormality seen in the abdomen or pelvis. Aortic Atherosclerosis (ICD10-I70.0). Electronically Signed   By: Lupita Raider, M.D.   On: 08/05/2017 09:35    Scheduled Meds: . enoxaparin (LOVENOX) injection  40 mg Subcutaneous Q24H  . folic acid  1 mg Oral Daily  . multivitamin with minerals  1 tablet Oral Daily  . [START ON 08/07/2017] thiamine injection  250 mg Intravenous Daily      Time spent: 20 min  Meredeth Ide   [facility] Pager [phone]. If 7PM-7AM, please contact night-coverage at [email or web address], Office  [phone]  password [email or web address]  08/06/2017, 2:01 PM  LOS: 1 day

## 2017-08-06 NOTE — Progress Notes (Signed)
Upon assessment of morning vitals, BP 72/50 manually. MD on call notified. Orders for fluid bolus placed. Will continue to monitor patient.

## 2017-08-06 NOTE — Progress Notes (Signed)
Chaplain introduced spiritual support as resource.  Pt has several family members at bedside at this time.  Is receptive to chaplain follow up.   [facility] Chaplain Burnis KingfisherMatthew Tristyn Demarest, MDiv, [facility]

## 2017-08-07 DIAGNOSIS — A4151 Sepsis due to Escherichia coli [E. coli]: Secondary | ICD-10-CM

## 2017-08-07 LAB — URINE CULTURE: Culture: >=100000 — AB

## 2017-08-07 LAB — BASIC METABOLIC PANEL
ANION GAP: 7 (ref 5–15)
BUN: 6 mg/dL (ref 6–20)
CO2: 23 mmol/L (ref 22–32)
Calcium: 7.7 mg/dL — ABNORMAL LOW (ref 8.9–10.3)
Chloride: 106 mmol/L (ref 101–111)
Creatinine, Ser: 0.52 mg/dL (ref 0.44–1.00)
GFR calc Af Amer: >60 mL/min (ref >60)
Glucose, Bld: 78 mg/dL (ref 65–99)
POTASSIUM: 3.6 mmol/L (ref 3.5–5.1)
SODIUM: 136 mmol/L (ref 135–145)

## 2017-08-07 LAB — MAGNESIUM: MAGNESIUM: 1.7 mg/dL (ref 1.7–2.4)

## 2017-08-07 MED ORDER — SODIUM CHLORIDE 0.9 % IV SOLN
INTRAVENOUS | Status: DC
  Administered 2017-08-07 (×2): via INTRAVENOUS

## 2017-08-07 MED ORDER — SODIUM CHLORIDE 0.9 % IV BOLUS
250.0000 mL | Freq: Once | INTRAVENOUS | Status: AC
  Administered 2017-08-07: 250 mL via INTRAVENOUS

## 2017-08-07 MED ORDER — CEPHALEXIN 500 MG PO CAPS
500.0000 mg | ORAL_CAPSULE | Freq: Two times a day (BID) | ORAL | Status: DC
  Administered 2017-08-08: 500 mg via ORAL
  Filled 2017-08-07: qty 1

## 2017-08-07 NOTE — Progress Notes (Addendum)
Pt BP 88/58. MD on call notified. Awaiting orders. Will continue to monitor pt.   ---MD instructed this RN to continue to monitor BP and notify for SBP <80.

## 2017-08-07 NOTE — Care Management Note (Signed)
Case Management Note  Patient Details  Name: Kari Hoffman MRN: [id number] Date of Birth: Aug 26, 1960  Subjective/Objective: PT recc SNF. CSW notified.                   Action/Plan:d/c SNF.   Expected Discharge Date:  (unknown)               Expected Discharge Plan:  Skilled Nursing Facility  In-House Referral:  Clinical Social Work  Discharge planning Services  CM Consult  Post Acute Care Choice:    Choice offered to:     DME Arranged:    DME Agency:     HH Arranged:    HH Agency:     Status of Service:  In process, will continue to follow  If discussed at Long Length of Stay Meetings, dates discussed:    Additional Comments:  Lanier ClamMahabir, Almendra Loria, RN 08/07/2017, 12:06 PM

## 2017-08-07 NOTE — Progress Notes (Signed)
Patients BP still low, pt is asymptomatic, no dizziness. Dr. Selena BattenKim notified. New orders received and will follow out. Will continue to monitor patient.

## 2017-08-07 NOTE — Progress Notes (Signed)
  Speech Language Pathology Treatment:    Patient Details Name: Kari Hoffman MRN: [id number] DOB: 1960-04-26 Today's Date: 08/07/2017 Time: [id number] SLP Time Calculation (min) (ACUTE ONLY): 15 min  Assessment / Plan / Recommendation Clinical Impression    HPI HPI: 57 [facility]  Pt PMH + ETOH use,smoking,  thryoid nodules, afib, ACDF C5-C6, C6-C7 01/2001 by Dr Wynetta Emeryram.  Pt with abdomen pain upon admit - CT abdomen negative for acute change, and stable adenoma.  CXR negative.  Pt does have a cough currently.     Pt with nearly empty meal tray on bedside table.  Pt did not pass Yale 3 ounce water test due to consuming approximately 2 ounces followed by immediate coughing again.  She admits today to premorbid occasional coughing with intake.  Reflux problems reported by pt PTA yesterday but she denies today.  SLP questions if reflux issues may be contributing to pt's cough.    Suspect pt may have intermittent mild aspiration but can not definitely state given baseline cough. However given she is young and normally ambulatory, suspect she tolerates well.    At this time, SLP will sign off - Educated pt/spouse to dysphagia mitigation strategies.   Informed pt that if she is intermittently aspirating, as she ages - she may not tolerate well and to be mindful of her swallowing.  Also encouraged her to speak to MD about possible reflux issues.     SLP Plan  Continue with current plan of care       Recommendations  Diet recommendations: Regular;Thin liquid Liquids provided via: Cup;Straw Medication Administration: (as tolerated) Supervision: Patient able to self feed Compensations: Minimize environmental distractions;Slow rate;Small sips/bites Postural Changes and/or Swallow Maneuvers: Seated upright 90 degrees;Upright 30-60 min after meal                Oral Care Recommendations: Oral care BID SLP Visit Diagnosis: Dysphagia, unspecified (R13.10) Plan: Continue with  current plan of care       GO                [name], Olon Russ Ann 08/07/2017, 1:52 PM   Donavan Burnetamara Eyoel Throgmorton, MS [facility] SLP [phone]

## 2017-08-07 NOTE — Progress Notes (Signed)
Pt BP 82/52 manual, Dr. Selena BattenKim paged and 250 ml bolus ordered. BP 80/64 MAP 72 post bolus. Pt asymptomatic, all other VSS. MD aware, no new orders at this time. Will continue to monitor.

## 2017-08-07 NOTE — Progress Notes (Signed)
[facility]  PROGRESS NOTE  Kari KendallDonna R Hoffman ZOX:[id number] DOB: Nov 24, 1960 DOA: 08/04/2017 PCP: Sandford Craze'Sullivan, [name], NP   Brief HPI:   57 y.o. female with medical history significant of atrial flutter, alcohol abuse, depression presenting with altered mental status and gait abnormalities concerning for Wernicke's encephalopathy.     Subjective   Patient seen and examined, continues to be pleasantly confused.  Answering questions appropriately.   Assessment/Plan:     1.  Acute encephalopathy-improving likely concerning for worsening sensory neuropathy patient started on thiamine.  Also has UTI with urine culture growing E. coli, sensitive to cefazolin and ceftriaxone.  Started on Keflex..   Negative urine toxicology, salicylates.  CT head showed no acute abnormality.  TSH 2.076   2.  Abdominal pain-resolved, CT abdomen/pelvis shows no acute abnormality.  3. Hypokalemia-replete, potassium was 2.9 replaced potassium with IV KCl 10 meq x 3.  Today potassium is 3.6  4.  History of atrial flutter-patient is not on anticoagulation, likely from gait instability, alcohol abuse.  5.  Alcohol abuse-patient has been drinking alcohol for past more than 20 years, no signs and symptoms of alcohol withdrawal this time.  Continue CIWA protocol.  6. Sepsis due to UTI-urine culture growing E. coli as above started on Keflex.  Blood pressure is low, will start on normal saline at 125/h.    DVT prophylaxis: Lovenox   Code Status: Full code  Family Communication: discussed with patient's husband at bedside  Disposition Plan: likely home when medically ready for discharge   Consultants:  None  Procedures:  None   Antibiotics:   Anti-infectives (From admission, onward)   Start     Dose/Rate Route Frequency Ordered Stop   08/08/17 0600  cephALEXin (KEFLEX) capsule 500 mg     500 mg Oral Every 12 hours 08/07/17 0901     08/06/17 0600  cefTRIAXone (ROCEPHIN) 1 g in sodium chloride  0.9 % 100 mL IVPB  Status:  Discontinued     1 g 200 mL/hr over 30 Minutes Intravenous Every 24 hours 08/05/17 1729 08/07/17 0900   08/05/17 0500  cefTRIAXone (ROCEPHIN) 1 g in sodium chloride 0.9 % 100 mL IVPB     1 g 200 mL/hr over 30 Minutes Intravenous  Once 08/05/17 0458 08/05/17 0747       Objective   Vitals:   08/07/17 0237 08/07/17 0407 08/07/17 0540 08/07/17 0629  BP: (!) 82/52 (!) 80/64 (!) 77/55 (!) 76/60  Pulse:  60 64 72  Resp:  14 20   Temp:  98 F (36.7 C) 97.6 F (36.4 C)   TempSrc:  Oral Oral   SpO2:  100% 100%   Weight:   62.6 kg (138 lb 0.1 oz)   Height:        Intake/Output Summary (Last 24 hours) at 08/07/2017 1140 Last data filed at 08/07/2017 0626 Gross per 24 hour  Intake 240 ml  Output 400 ml  Net -160 ml   Filed Weights   08/05/17 0210 08/06/17 0502 08/07/17 0540  Weight: 54.4 kg (120 lb) 58 kg (127 lb 13.9 oz) 62.6 kg (138 lb 0.1 oz)     Physical Examination:   Physical Exam: Eyes: No icterus, extraocular muscles intact  Mouth: Oral mucosa is moist, no lesions on palate,  Neck: Supple, no deformities, masses, or tenderness Lungs: Normal respiratory effort, bilateral clear to auscultation, no crackles or wheezes.  Heart: Regular rate and rhythm, S1 and S2 normal, no murmurs, rubs auscultated Abdomen: BS normoactive,soft,nondistended,non-tender to  palpation,no organomegaly Extremities: No pretibial edema, no erythema, no cyanosis, no clubbing Neuro : Alert and oriented to time, place and person, No focal deficits      Data Reviewed: I have personally reviewed following labs and imaging studies  CBG: Recent Labs  Lab 08/05/17 2051  GLUCAP 125*    CBC: Recent Labs  Lab 08/05/17 0115 08/05/17 1611 08/06/17 0648  WBC 7.8 8.1 7.2  HGB 14.2 11.4* 10.7*  HCT 40.3 33.3* 31.7*  MCV 96.6 97.4 99.1  PLT 163 160 160    Basic Metabolic Panel: Recent Labs  Lab 08/05/17 0115 08/05/17 0311 08/05/17 1611 08/06/17 0648  08/07/17 0444  NA 138  --   --  140 136  K 3.1*  --   --  2.9* 3.6  CL 101  --   --  108 106  CO2 23  --   --  23 23  GLUCOSE 123*  --   --  94 78  BUN 26*  --   --  13 6  CREATININE 0.90  --  0.70 0.68 0.52  CALCIUM 9.1  --   --  7.7* 7.7*  MG  --  2.1  --   --  1.7    Recent Results (from the past 240 hour(s))  Culture, Urine     Status: Abnormal   Collection Time: 08/05/17  2:43 AM  Result Value Ref Range Status   Specimen Description   Final    URINE, CLEAN CATCH Performed at [facility], [address] [place], [place] [address]    Special Requests   Final    NONE Performed at [facility], [address], [place], [place] [address]    Culture >=100,000 COLONIES/mL ESCHERICHIA COLI (A)  Final   Report Status 08/07/2017 FINAL  Final   Organism ID, Bacteria ESCHERICHIA COLI (A)  Final      Susceptibility   Escherichia coli - MIC*    AMPICILLIN 16 INTERMEDIATE Intermediate     CEFAZOLIN <=4 SENSITIVE Sensitive     CEFTRIAXONE <=1 SENSITIVE Sensitive     CIPROFLOXACIN <=0.25 SENSITIVE Sensitive     GENTAMICIN <=1 SENSITIVE Sensitive     IMIPENEM <=0.25 SENSITIVE Sensitive     NITROFURANTOIN 32 SENSITIVE Sensitive     TRIMETH/SULFA <=20 SENSITIVE Sensitive     AMPICILLIN/SULBACTAM 4 SENSITIVE Sensitive     PIP/TAZO <=4 SENSITIVE Sensitive     Extended ESBL NEGATIVE Sensitive     * >=100,000 COLONIES/mL ESCHERICHIA COLI     Liver Function Tests: Recent Labs  Lab 08/05/17 0115 08/06/17 0648  AST 43* 43*  ALT 48 42  ALKPHOS 81 54  BILITOT 1.3* 0.4  PROT 6.8 4.8*  ALBUMIN 3.0* 2.1*   Recent Labs  Lab 08/05/17 0115  LIPASE 56*   Recent Labs  Lab 08/05/17 0116  AMMONIA 13    Cardiac Enzymes: Recent Labs  Lab 08/05/17 0311  CKTOTAL 107  TROPONINI <0.03   BNP (last 3 results) No results for input(s): BNP in the last 8760 hours.  ProBNP (last 3 results) No results for input(s): PROBNP in the last 8760  hours.    Studies: No results found.  Scheduled Meds: . [START ON 08/08/2017] cephALEXin  500 mg Oral Q12H  . enoxaparin (LOVENOX) injection  40 mg Subcutaneous Q24H  . folic acid  1 mg Oral Daily  . multivitamin with minerals  1 tablet Oral Daily  . thiamine injection  250 mg Intravenous Daily  Time spent: 20 min  Meredeth Ide   [facility] Pager [phone]. If 7PM-7AM, please contact night-coverage at [email or web address], Office  [phone]  password [email or web address]  08/07/2017, 11:40 AM  LOS: 2 days

## 2017-08-07 NOTE — Evaluation (Signed)
Physical Therapy Evaluation Patient Details Name: Kari Hoffman MRN: [id number] DOB: Aug 21, 1960 Today's Date: 08/07/2017   History of Present Illness  Kari Hoffman is a 57 y.o. female with medical history significant of atrial flutter, alcohol abuse, depression presenting with altered mental status and gait abnormalities concerning for Wernicke's encephalopathy  Clinical Impression  The patient  Is not oriented to time. Ambulated x 40' with moderate assist of 1. Gait is unsteady with RW. Orthostaic BP in Manufacturing engineer. No overt drop. Oxygen satauration on RA 98%. Pt admitted with above diagnosis. Pt currently with functional limitations due to the deficits listed below (see PT Problem List).  Pt will benefit from skilled PT to increase their independence and safety with mobility to allow discharge to the venue listed below.       Follow Up Recommendations SNF patient and spouse agreeable, patient is home alone    Equipment Recommendations  None recommended by PT    Recommendations for Other Services       Precautions / Restrictions Precautions Precautions: Fall Restrictions Weight Bearing Restrictions: No      Mobility  Bed Mobility Overal bed mobility: Needs Assistance Bed Mobility: Supine to Sit     Supine to sit: Min assist     General bed mobility comments: extra time  Transfers Overall transfer level: Needs assistance Equipment used: Rolling walker (2 wheeled) Transfers: Sit to/from Stand Sit to Stand: Mod assist;From elevated surface         General transfer comment: cues for hand position, Assist to rise from bed x 2 trials to stand. Stood from Copywriter, advertising on first trial.  Ambulation/Gait Ambulation/Gait assistance: Mod assist Ambulation Distance (Feet): 5 Feet(then 40) Assistive device: Rolling walker (2 wheeled) Gait Pattern/deviations: Step-through pattern;Decreased step length - right;Staggering left;Staggering right     General Gait Details: steady  assist and tight hold on safety belt. Noted mildly ataxic.   Stairs            Wheelchair Mobility    Modified Rankin (Stroke Patients Only)       Balance Overall balance assessment: History of Falls;Needs assistance Sitting-balance support: Feet supported;No upper extremity supported Sitting balance-Leahy Scale: Fair     Standing balance support: During functional activity;Bilateral upper extremity supported Standing balance-Leahy Scale: Poor Standing balance comment: feet in constanct motion                             Pertinent Vitals/Pain Pain Assessment: No/denies pain    Home Living Family/patient expects to be discharged to:: Private residence Living Arrangements: Spouse/significant other Available Help at Discharge: Family;Available PRN/intermittently Type of Home: House Home Access: Stairs to enter   [employer] of Steps: 3 + 2 or none if walk around house Home Layout: One level Home Equipment: None Additional Comments: spouse works    Prior Function Level of Independence: Independent               Higher education careers adviser        Extremity/Trunk Assessment   Upper Extremity Assessment Upper Extremity Assessment: Generalized weakness    Lower Extremity Assessment Lower Extremity Assessment: Generalized weakness;RLE deficits/detail;LLE deficits/detail RLE Deficits / Details: reports mild numbness LLE Deficits / Details: same as right    Cervical / Trunk Assessment Cervical / Trunk Assessment: Normal  Communication   Communication: No difficulties  Cognition Arousal/Alertness: Awake/alert Behavior During Therapy: WFL for tasks assessed/performed Overall Cognitive Status: Impaired/Different from baseline Area of  Impairment: Orientation;Memory                 Orientation Level: Time                    General Comments      Exercises     Assessment/Plan    PT Assessment Patient needs continued PT  services  PT Problem List Decreased strength;Decreased cognition;Decreased knowledge of use of DME;Decreased activity tolerance;Decreased safety awareness;Decreased balance;Decreased knowledge of precautions;Decreased mobility       PT Treatment Interventions DME instruction;Gait training;Functional mobility training;Therapeutic activities;Therapeutic exercise;Patient/family education    PT Goals (Current goals can be found in the Care Plan section)  Acute Rehab PT Goals Patient Stated Goal: to get stronger, go home PT Goal Formulation: With patient/family Time For Goal Achievement: 08/21/17 Potential to Achieve Goals: Good    Frequency Min 3X/week   Barriers to discharge Decreased caregiver support      Co-evaluation               AM-PAC PT "6 Clicks" Daily Activity  Outcome Measure Difficulty turning over in bed (including adjusting bedclothes, sheets and blankets)?: A Little Difficulty moving from lying on back to sitting on the side of the bed? : A Little Difficulty sitting down on and standing up from a chair with arms (e.g., wheelchair, bedside commode, etc,.)?: A Lot Help needed moving to and from a bed to chair (including a wheelchair)?: A Lot Help needed walking in hospital room?: Total Help needed climbing 3-5 steps with a railing? : Total 6 Click Score: 12    End of Session Equipment Utilized During Treatment: Gait belt Activity Tolerance: Patient tolerated treatment well Patient left: in chair;with call bell/phone within reach;with chair alarm set;with family/visitor present Nurse Communication: Mobility status PT Visit Diagnosis: Unsteadiness on feet (R26.81);History of falling (Z91.81)    Time: [id number] PT Time Calculation (min) (ACUTE ONLY): 39 min   Charges:   PT Evaluation $PT Eval Low Complexity: 1 Low PT Treatments $Gait Training: 8-22 mins $Therapeutic Activity: 8-22 mins   PT G CodesBlanchard Hoffman:       Kari Hoffman PT [phone]   Kari Hoffman, Kari Bruhl  Hoffman 08/07/2017, 11:24 AM

## 2017-08-08 DIAGNOSIS — R400 Somnolence: Secondary | ICD-10-CM

## 2017-08-08 LAB — BASIC METABOLIC PANEL
Anion gap: 7 (ref 5–15)
Anion gap: 6 (ref 5–15)
BUN: <5 mg/dL — ABNORMAL LOW (ref 6–20)
BUN: <5 mg/dL — ABNORMAL LOW (ref 6–20)
CHLORIDE: 107 mmol/L (ref 101–111)
CHLORIDE: 106 mmol/L (ref 101–111)
CO2: 20 mmol/L — ABNORMAL LOW (ref 22–32)
CO2: 23 mmol/L (ref 22–32)
Calcium: 7.2 mg/dL — ABNORMAL LOW (ref 8.9–10.3)
Calcium: 7.8 mg/dL — ABNORMAL LOW (ref 8.9–10.3)
Creatinine, Ser: 0.41 mg/dL — ABNORMAL LOW (ref 0.44–1.00)
Creatinine, Ser: 0.45 mg/dL (ref 0.44–1.00)
GFR calc Af Amer: >60 mL/min (ref >60)
GFR calc Af Amer: >60 mL/min (ref >60)
GFR calc non Af Amer: >60 mL/min (ref >60)
GFR calc non Af Amer: >60 mL/min (ref >60)
GLUCOSE: 86 mg/dL (ref 65–99)
GLUCOSE: 112 mg/dL — AB (ref 65–99)
POTASSIUM: 3 mmol/L — AB (ref 3.5–5.1)
POTASSIUM: 3.6 mmol/L (ref 3.5–5.1)
Sodium: 134 mmol/L — ABNORMAL LOW (ref 135–145)
Sodium: 135 mmol/L (ref 135–145)

## 2017-08-08 LAB — CBC
HCT: 29.1 % — ABNORMAL LOW (ref 36.0–46.0)
Hemoglobin: 9.8 g/dL — ABNORMAL LOW (ref 12.0–15.0)
MCH: 33.6 pg (ref 26.0–34.0)
MCHC: 33.7 g/dL (ref 30.0–36.0)
MCV: 99.7 fL (ref 78.0–100.0)
PLATELETS: 155 10*3/uL (ref 150–400)
RBC: 2.92 MIL/uL — AB (ref 3.87–5.11)
RDW: 12.8 % (ref 11.5–15.5)
WBC: 4.9 10*3/uL (ref 4.0–10.5)

## 2017-08-08 MED ORDER — FOLIC ACID 1 MG PO TABS: 1.0000 mg | ORAL_TABLET | Freq: Every day | ORAL | 2 refills | Status: DC

## 2017-08-08 MED ORDER — VITAMIN B-1 100 MG PO TABS: 100.0000 mg | ORAL_TABLET | Freq: Every day | ORAL | 2 refills | Status: DC

## 2017-08-08 MED ORDER — CEPHALEXIN 500 MG PO CAPS: 500.0000 mg | ORAL_CAPSULE | Freq: Two times a day (BID) | ORAL | 0 refills | Status: DC

## 2017-08-08 MED ORDER — POTASSIUM CHLORIDE CRYS ER 20 MEQ PO TBCR
40.0000 meq | EXTENDED_RELEASE_TABLET | ORAL | Status: AC
  Administered 2017-08-08 (×2): 40 meq via ORAL
  Filled 2017-08-08 (×2): qty 2

## 2017-08-08 NOTE — Care Management Note (Signed)
Case Management Note  Patient Details  Name: Philis KendallDonna R Petrasek MRN: [id number] Date of Birth: 20-Jul-1960  Subjective/Objective: CM/CSW went into rm to discuss d/c plans(see CSW note)-Spoke to patient/spouse about HHC-Insurance is UHC-chose [facility] rep Clydie BraunKaren aware of d/c & HHC orders.Infomred patient/spouse of HHC services(intermittent,skilled services, informed of private duty services(custodial level-out of pocket cost)-patient/spouse voiced understanding. Home rw ordered-[facility] to bring to rm prior d/c. Start of care 08/10/17(Sunday)agreed by patient/spouse/[facility]/attending notified. No further CM needs.                 Action/Plan:d/c home w/HHC/dme.   Expected Discharge Date:  (unknown)               Expected Discharge Plan:  Home w Home Health Services  In-House Referral:  Clinical Social Work  Discharge planning Services  CM Consult  Post Acute Care Choice:    Choice offered to:     DME Arranged:  Walker rolling DME Agency:  [facility]  HH Arranged:  RN, PT, Nurse's Aide, Social Work [employer] [facility]:  [facility]  Status of Service:  Completed, signed off  If discussed at [employer] Meetings, dates discussed:    Additional Comments:  Lanier ClamMahabir, Jaqwan Wieber, RN 08/08/2017, 11:11 AM

## 2017-08-08 NOTE — Discharge Summary (Addendum)
Physician Discharge Summary  Kari KendallDonna R Scalici ZOX:[id number] DOB: 07-29-1960 DOA: 08/04/2017  PCP: Sandford Craze'Sullivan, [name], NP  Admit date: 08/04/2017 Discharge date: 08/08/2017  Time spent: 35* minutes  Recommendations for Outpatient Follow-up:  1. Follow up PCP in 2 weeks   Discharge Diagnoses:  Active Problems:   Altered mental status   Acute encephalopathy   Discharge Condition: Stable  Diet recommendation: Regular diet  Filed Weights   08/06/17 0502 08/07/17 0540 08/08/17 0356  Weight: 58 kg (127 lb 13.9 oz) 62.6 kg (138 lb 0.1 oz) 60.1 kg (132 lb 7.9 oz)    History of present illness:  57 y.o.femalewith medical history significant ofatrial flutter, alcohol abuse, depression presenting with altered mental status and gait abnormalities concerning for Wernicke's encephalopathy.      Hospital Course:    1.  Acute encephalopathy-improving likely concerning for worsening sensory neuropathy patient started on thiamine. Will discharge on Thiamine and Folate as below.  Also has UTI with urine culture growing E. coli, sensitive to cefazolin and ceftriaxone.  Started on Keflex..   Negative urine toxicology, salicylates.  CT head showed no acute abnormality.  TSH 2.076   2.  Abdominal pain-resolved, CT abdomen/pelvis shows no acute abnormality.  3. Hypokalemia-replete, potassium was 3.0 yhis morning, replete. Repeat K is 3.6  4.  History of atrial flutter-patient is not on anticoagulation, likely from gait instability, alcohol abuse.  5.  Alcohol abuse-patient has been drinking alcohol for past more than 20 years, no signs and symptoms of alcohol withdrawal this time. Counseled to quit drinking alcohol.   6. Sepsis due to UTI-urine culture growing E. coli as above started on Keflex for seven more days.     Procedures:  None   Consultations:  None   Discharge Exam: Vitals:   08/08/17 0025 08/08/17 0356  BP: (!) 88/67 103/73  Pulse: 65 66  Resp:  16  Temp:  98.3  F (36.8 C)  SpO2:  100%    General: Appears in no acute distress Cardiovascular: S1S2 RRR Respiratory: Clear bilaterally  Discharge Instructions   Discharge Instructions    Diet - low sodium heart healthy   Complete by:  As directed    Increase activity slowly   Complete by:  As directed      Allergies as of 08/08/2017   No Known Allergies     Medication List    TAKE these medications   amLODipine 5 MG tablet Commonly known as:  NORVASC Take 1 tablet (5 mg total) by mouth daily.   cephALEXin 500 MG capsule Commonly known as:  KEFLEX Take 1 capsule (500 mg total) by mouth every 12 (twelve) hours.   folic acid 1 MG tablet Commonly known as:  FOLVITE Take 1 tablet (1 mg total) by mouth daily. Start taking on:  08/09/2017   thiamine 100 MG tablet Commonly known as:  VITAMIN B-1 Take 1 tablet (100 mg total) by mouth daily.            Durable Medical Equipment  (From admission, onward)        Start     Ordered   08/08/17 1110  For home use only DME Walker rolling  Once    Question:  Patient needs a walker to treat with the following condition  Answer:  Unsteady gait   08/08/17 1109     No Known Allergies Follow-up Information    [facility] - Dme Follow up.   Why:  rolling walker Contact information: [address] [place] [address] [phone]        [facility]-Home Follow up.   Specialty:  Home Health Services Why:  [facility] nursing/physical therapy/aide/social worker start of care date 08/10/17-Sunday Contact information: [address] [place] [address] [phone]            The results of significant diagnostics from this hospitalization (including imaging, microbiology, ancillary and laboratory) are listed below for reference.    Significant Diagnostic Studies: Dg Chest 2 View  Result Date: 08/05/2017 CLINICAL DATA:  Acute onset of cough.  Altered mental status. EXAM: CHEST - 2 VIEW  COMPARISON:  None. FINDINGS: The lungs are well-aerated and clear. There is no evidence of focal opacification, pleural effusion or pneumothorax. Bilateral nipple shadows are noted. The heart is normal in size; the mediastinal contour is within normal limits. No acute osseous abnormalities are seen. Cervical spinal fusion hardware is noted. IMPRESSION: No acute cardiopulmonary process seen. Electronically Signed   By: Roanna Raider M.D.   On: 08/05/2017 01:31   Ct Head Wo Contrast  Result Date: 08/05/2017 CLINICAL DATA:  Altered LOC EXAM: CT HEAD WITHOUT CONTRAST TECHNIQUE: Contiguous axial images were obtained from the base of the skull through the vertex without intravenous contrast. COMPARISON:  None. FINDINGS: Brain: No evidence of acute infarction, hemorrhage, hydrocephalus, extra-axial collection or mass lesion/mass effect. Mild atrophy. Minimal small vessel ischemic changes of the white matter. Vascular: No hyperdense vessels.  Carotid vascular calcification Skull: Normal. Negative for fracture or focal lesion. Sinuses/Orbits: Mucosal thickening and fluid in the left maxillary sinus. No acute orbital abnormality Other: None IMPRESSION: 1. No CT evidence for acute intracranial abnormality. 2. Mild atrophy and minimal small vessel ischemic changes of white matter Electronically Signed   By: Jasmine Pang M.D.   On: 08/05/2017 01:51   Ct Abdomen Pelvis W Contrast  Result Date: 08/05/2017 CLINICAL DATA:  Left upper quadrant abdominal pain, nausea, vomiting. EXAM: CT ABDOMEN AND PELVIS WITH CONTRAST TECHNIQUE: Multidetector CT imaging of the abdomen and pelvis was performed using the standard protocol following bolus administration of intravenous contrast. CONTRAST:  80mL ISOVUE-300 IOPAMIDOL (ISOVUE-300) INJECTION 61% COMPARISON:  CT scan of December 24, 2009. FINDINGS: Lower chest: Stable 6 mm nodule seen in right posterior costophrenic sulcus. No other abnormality seen in visualized lung bases.  Hepatobiliary: No focal liver abnormality is seen. No gallstones, gallbladder wall thickening, or biliary dilatation. Pancreas: Unremarkable. No pancreatic ductal dilatation or surrounding inflammatory changes. Spleen: Normal in size without focal abnormality. Adrenals/Urinary Tract: Stable left adrenal adenoma. Right adrenal gland appears normal. No hydronephrosis or renal obstruction is noted. No renal or ureteral calculi are noted. Urinary bladder is unremarkable. Stomach/Bowel: Stomach is within normal limits. Appendix appears normal. No evidence of bowel wall thickening, distention, or inflammatory changes. Vascular/Lymphatic: Aortic atherosclerosis. No enlarged abdominal or pelvic lymph nodes. Reproductive: Uterus and bilateral adnexa are unremarkable. Other: No abdominal wall hernia or abnormality. No abdominopelvic ascites. Musculoskeletal: No acute or significant osseous findings. IMPRESSION: Stable left adrenal adenoma. No acute abnormality seen in the abdomen or pelvis. Aortic Atherosclerosis (ICD10-I70.0). Electronically Signed   By: Lupita Raider, M.D.   On: 08/05/2017 09:35    Microbiology: Recent Results (from the past 240 hour(s))  Culture, Urine     Status: Abnormal   Collection Time: 08/05/17  2:43 AM  Result Value Ref Range Status   Specimen Description   Final    URINE, CLEAN CATCH Performed at [facility],  [address], [place], [place] [address]    Special Requests   Final    NONE Performed at [facility], [address] [place], [place] [address]    Culture >=100,000 COLONIES/mL ESCHERICHIA COLI (A)  Final   Report Status 08/07/2017 FINAL  Final   Organism ID, Bacteria ESCHERICHIA COLI (A)  Final      Susceptibility   Escherichia coli - MIC*    AMPICILLIN 16 INTERMEDIATE Intermediate     CEFAZOLIN <=4 SENSITIVE Sensitive     CEFTRIAXONE <=1 SENSITIVE Sensitive     CIPROFLOXACIN <=0.25 SENSITIVE Sensitive     GENTAMICIN <=1  SENSITIVE Sensitive     IMIPENEM <=0.25 SENSITIVE Sensitive     NITROFURANTOIN 32 SENSITIVE Sensitive     TRIMETH/SULFA <=20 SENSITIVE Sensitive     AMPICILLIN/SULBACTAM 4 SENSITIVE Sensitive     PIP/TAZO <=4 SENSITIVE Sensitive     Extended ESBL NEGATIVE Sensitive     * >=100,000 COLONIES/mL ESCHERICHIA COLI     Labs: Basic Metabolic Panel: Recent Labs  Lab 08/05/17 0115 08/05/17 0311 08/05/17 1611 08/06/17 0648 08/07/17 0444 08/08/17 0518 08/08/17 1305  NA 138  --   --  140 136 134* 135  K 3.1*  --   --  2.9* 3.6 3.0* 3.6  CL 101  --   --  108 106 107 106  CO2 23  --   --  23 23 20* 23  GLUCOSE 123*  --   --  94 78 86 112*  BUN 26*  --   --  13 6 <5* <5*  CREATININE 0.90  --  0.70 0.68 0.52 0.41* 0.45  CALCIUM 9.1  --   --  7.7* 7.7* 7.2* 7.8*  MG  --  2.1  --   --  1.7  --   --    Liver Function Tests: Recent Labs  Lab 08/05/17 0115 08/06/17 0648  AST 43* 43*  ALT 48 42  ALKPHOS 81 54  BILITOT 1.3* 0.4  PROT 6.8 4.8*  ALBUMIN 3.0* 2.1*   Recent Labs  Lab 08/05/17 0115  LIPASE 56*   Recent Labs  Lab 08/05/17 0116  AMMONIA 13   CBC: Recent Labs  Lab 08/05/17 0115 08/05/17 1611 08/06/17 0648 08/08/17 0518  WBC 7.8 8.1 7.2 4.9  HGB 14.2 11.4* 10.7* 9.8*  HCT 40.3 33.3* 31.7* 29.1*  MCV 96.6 97.4 99.1 99.7  PLT 163 160 160 155   Cardiac Enzymes: Recent Labs  Lab 08/05/17 0311  CKTOTAL 107  TROPONINI <0.03   BNP: BNP (last 3 results) No results for input(s): BNP in the last 8760 hours.  ProBNP (last 3 results) No results for input(s): PROBNP in the last 8760 hours.  CBG: Recent Labs  Lab 08/05/17 2051  GLUCAP 125*    Signed:  Meredeth Ide MD.  [facility] 08/08/2017, 2:13 PM

## 2017-08-08 NOTE — Progress Notes (Signed)
CSW consulted for SNF placement. Per attending MD patient medically stable for dc. CSW and RNCM spoke with patient and patient's husband at bedside regarding PT recommendation for SNF for ST rehab. CSW explained SNF placement process and insurance authorization process. CSW inquired about patient's ability to private pay for SNF while insurance authorization was initiated by a SNF or if [facility]. Patient's husband reported that private pay is not an option. Patient/patient's husband reported that they are agreeable to home health services and pursuing SNF from home if needed. RNCM following to assist with home health services. CSW signing off, no other needs identified at this time.   Celso SickleKimberly Lynn Recendiz, [place]WA Clinical Social Worker [facility] Cell#: [phone]

## 2017-08-12 ENCOUNTER — Telehealth: Payer: Self-pay | Admitting: *Deleted

## 2017-08-12 NOTE — Telephone Encounter (Signed)
Received Physician Orders from [facility]; forwarded to provider/SLS 04/23  

## 2017-08-20 ENCOUNTER — Ambulatory Visit (INDEPENDENT_AMBULATORY_CARE_PROVIDER_SITE_OTHER): Payer: 59 | Admitting: Family

## 2017-08-20 ENCOUNTER — Encounter: Payer: Self-pay | Admitting: Family

## 2017-08-20 VITALS — BP 110/70 | HR 93 | Temp 98.3°F | Resp 16 | Ht 66.0 in | Wt 155.6 lb

## 2017-08-20 DIAGNOSIS — D649 Anemia, unspecified: Secondary | ICD-10-CM

## 2017-08-20 DIAGNOSIS — Z8679 Personal history of other diseases of the circulatory system: Secondary | ICD-10-CM

## 2017-08-20 DIAGNOSIS — R609 Edema, unspecified: Secondary | ICD-10-CM

## 2017-08-20 DIAGNOSIS — K709 Alcoholic liver disease, unspecified: Secondary | ICD-10-CM | POA: Diagnosis not present

## 2017-08-20 DIAGNOSIS — E876 Hypokalemia: Secondary | ICD-10-CM | POA: Diagnosis not present

## 2017-08-20 DIAGNOSIS — F32A Depression, unspecified: Secondary | ICD-10-CM

## 2017-08-20 DIAGNOSIS — F329 Major depressive disorder, single episode, unspecified: Secondary | ICD-10-CM | POA: Diagnosis not present

## 2017-08-20 LAB — COMPREHENSIVE METABOLIC PANEL
ALBUMIN: 2.6 g/dL — AB (ref 3.5–5.2)
ALK PHOS: 90 U/L (ref 39–117)
ALT: 19 U/L (ref 0–35)
AST: 22 U/L (ref 0–37)
BUN: 7 mg/dL (ref 6–23)
CHLORIDE: 108 meq/L (ref 96–112)
CO2: 28 mEq/L (ref 19–32)
CREATININE: 0.63 mg/dL (ref 0.40–1.20)
Calcium: 8.4 mg/dL (ref 8.4–10.5)
GFR: 103.64 mL/min (ref >60.00)
GLUCOSE: 83 mg/dL (ref 70–99)
POTASSIUM: 4.6 meq/L (ref 3.5–5.1)
SODIUM: 140 meq/L (ref 135–145)
TOTAL PROTEIN: 5.3 g/dL — AB (ref 6.0–8.3)
Total Bilirubin: 0.4 mg/dL (ref 0.2–1.2)

## 2017-08-20 LAB — CBC WITH DIFFERENTIAL/PLATELET
BASOS PCT: 0.8 % (ref 0.0–3.0)
Basophils Absolute: 0 10*3/uL (ref 0.0–0.1)
EOS ABS: 0 10*3/uL (ref 0.0–0.7)
EOS PCT: 0.3 % (ref 0.0–5.0)
HCT: 27.8 % — ABNORMAL LOW (ref 36.0–46.0)
Hemoglobin: 9.3 g/dL — ABNORMAL LOW (ref 12.0–15.0)
LYMPHS ABS: 1 10*3/uL (ref 0.7–4.0)
Lymphocytes Relative: 18.4 % (ref 12.0–46.0)
MCHC: 33.5 g/dL (ref 30.0–36.0)
MCV: 102.1 fl — ABNORMAL HIGH (ref 78.0–100.0)
MONO ABS: 0.6 10*3/uL (ref 0.1–1.0)
Monocytes Relative: 10.9 % (ref 3.0–12.0)
NEUTROS PCT: 69.6 % (ref 43.0–77.0)
Neutro Abs: 3.9 10*3/uL (ref 1.4–7.7)
Platelets: 220 10*3/uL (ref 150.0–400.0)
RBC: 2.72 Mil/uL — ABNORMAL LOW (ref 3.87–5.11)
RDW: 14.5 % (ref 11.5–15.5)
WBC: 5.5 10*3/uL (ref 4.0–10.5)

## 2017-08-20 LAB — PROTIME-INR
INR: 1 ratio (ref 0.8–1.0)
Prothrombin Time: 11.5 s (ref 9.6–13.1)

## 2017-08-20 LAB — VITAMIN B12: VITAMIN B 12: 893 pg/mL (ref 211–911)

## 2017-08-20 MED ORDER — SERTRALINE HCL 50 MG PO TABS: ORAL_TABLET | ORAL | 0 refills | Status: DC

## 2017-08-20 NOTE — Patient Instructions (Signed)
Please begin zoloft 1/2 tab once daily for 1 week, then increase to a full tab once daily on week two. Work on a diet high in lean protein. Work on cutting back and eventually quitting alcohol all together.  Let me know when you are ready for a referral to an outpatient alcohol treatment program and we can help you.

## 2017-08-20 NOTE — Progress Notes (Signed)
Subjective:    Patient ID: Kari Hoffman, female    DOB: 01-26-1961, 57 y.o.   MRN: [id number]  HPI   Patient is a 57 yr old female with history of alcohol abuse who presents today for hospital follow up.  I have not seen her since 2015.  Discharge summary is reviewed.  The patient was admitted April 15 through April 19 due to acute encephalopathy.  She was treated for UTI with probable sepsis due to E. coli.  She was treated with a seven-day course of Keflex upon discharge.  She underwent a CT of the head which showed no acute abnormalities.  Thyroid function was normal.  She was discharged home on thiamine and folate.  She did have a CT of the abdomen and pelvis during this admission which showed no acute abnormality.  She was noted to have hypokalemia during her admission and this was repleted.  She does have a history of atrial flutter and is not currently on anticoagulation.  She denies any dysuria/urinary symptoms since discharge. Reports that she continues to drink alcohol. Admits to 6-8 beers each night.   Review of Systems See HPI  Past Medical History:  Diagnosis Date  . Atrial flutter (HCC)   . Depression   . Ejection fraction   . H/O alcohol abuse   . Low back pain   . Multiple thyroid nodules      Social History   Socioeconomic History  . Marital status: Married    Spouse name: Not on file  . Number of children: 2  . Years of education: Not on file  . Highest education level: Not on file  Occupational History  . Occupation: Designer, television/film set  . Financial resource strain: Not on file  . Food insecurity:    Worry: Not on file    Inability: Not on file  . Transportation needs:    Medical: Not on file    Non-medical: Not on file  Tobacco Use  . Smoking status: Current Some Day Smoker    Packs/day: 1.00    Last attempt to quit: 07/10/2011    Years since quitting: 6.1  . Smokeless tobacco: Never Used  . Tobacco comment: smoking since 1986  Substance and  Sexual Activity  . Alcohol use: Yes    Comment: socially  . Drug use: Not on file  . Sexual activity: Not on file  Lifestyle  . Physical activity:    Days per week: Not on file    Minutes per session: Not on file  . Stress: Not on file  Relationships  . Social connections:    Talks on phone: Not on file    Gets together: Not on file    Attends religious service: Not on file    Active member of club or organization: Not on file    Attends meetings of clubs or organizations: Not on file    Relationship status: Not on file  . Intimate partner violence:    Fear of current or ex partner: Not on file    Emotionally abused: Not on file    Physically abused: Not on file    Forced sexual activity: Not on file  Other Topics Concern  . Not on file  Social History Narrative   Married 13 yrs (2nd marriage)   Works at [employer]    Past Surgical History:  Procedure Laterality Date  . ABDOMINAL HYSTERECTOMY  1989   left oophorectomy  . ANTERIOR CERVICAL  DISCECTOMY    . C5- C6, C6- C7 anterior cervical diskectomy and fusion with allograft and anterir plating    . scar tissue removal   2004    Family History  Problem Relation Age of Onset  . Cancer Mother        bone  . Leukemia Father   . Diabetes Brother        type 2  . Coronary artery disease Neg Hx     No Known Allergies  Current Outpatient Medications on File Prior to Visit  Medication Sig Dispense Refill  . folic acid (FOLVITE) 1 MG tablet Take 1 tablet (1 mg total) by mouth daily. 30 tablet 2  . thiamine (VITAMIN B-1) 100 MG tablet Take 1 tablet (100 mg total) by mouth daily. 30 tablet 2  . amLODipine (NORVASC) 5 MG tablet Take 1 tablet (5 mg total) by mouth daily. (Patient not taking: Reported on 08/20/2017) 30 tablet 0   No current facility-administered medications on file prior to visit.     BP 110/70 (BP Location: Right Arm, Cuff Size: Normal)   Pulse 93   Temp 98.3 F (36.8 C) (Oral)   Resp 16   Ht   (1.676 m)   Wt 155 lb 9.6 oz (70.6 kg)   SpO2 98%   BMI 25.11 kg/m       Objective:   Physical Exam  Constitutional: She is oriented to person, place, and time.  Pale chronically ill appearing white female  Cardiovascular: Normal rate, regular rhythm and normal heart sounds.  No murmur heard. Pulmonary/Chest: Effort normal and breath sounds normal. No respiratory distress. She has no wheezes.  Abdominal: Soft. She exhibits no ascites. There is no tenderness.  Musculoskeletal:  4+ bilateral LE edema  Neurological: She is alert and oriented to person, place, and time.  Psychiatric: She has a normal mood and affect. Her behavior is normal. Judgment and thought content normal.          Assessment & Plan:  Depression- Scored 9 on PHQ-9- she is open minded to trying medication for her depression. Will initiate zoloft.  I instructed pt to start 1/2 tablet once daily for 1 week and then increase to a full tablet once daily on week two as tolerated.  We discussed common side effects such as nausea, drowsiness and weight gain.  Also discussed rare but serious side effect of suicide ideation.  She is instructed to discontinue medication go directly to ED if this occurs.  Pt verbalizes understanding.  Plan follow up in 1 month to evaluate progress.    Alcohol abuse-  Discussed risks of ongoing alcohol abuse. I suspect advance liver disease given her hx and low albumin.  Discussed referral to an outpatient alcohol treatment program and she declines at this time.  Edema- likely secondary to hypoalbuminemia. Discussed importance of a diet high in lean protein.  Anemia- check b12 level. IFOB.   Hx of atrial flutter- was in NSR per EKG performed at hospital. She would be a high fall risk for anticoagulation.  Monitor off of anticoagulation.

## 2017-08-22 ENCOUNTER — Telehealth: Payer: Self-pay | Admitting: Family

## 2017-08-22 NOTE — Telephone Encounter (Signed)
Copied from CRM [id number]. Topic: Quick Communication - Lab Results >> Aug 22, 2017 11:51 AM Kathi Simpers, CMA wrote: See result note from 08/20/17. Ok for [facility] / Triage to discuss with patient. Thanks 1

## 2017-08-22 NOTE — Telephone Encounter (Signed)
Left VM to return call, results in result notes. 

## 2017-09-10 ENCOUNTER — Encounter: Payer: Self-pay | Admitting: Family

## 2017-09-10 ENCOUNTER — Ambulatory Visit (INDEPENDENT_AMBULATORY_CARE_PROVIDER_SITE_OTHER): Payer: 59 | Admitting: Family

## 2017-09-10 VITALS — BP 123/86 | HR 101 | Temp 97.9°F | Resp 16 | Ht 66.0 in | Wt 126.2 lb

## 2017-09-10 DIAGNOSIS — F329 Major depressive disorder, single episode, unspecified: Secondary | ICD-10-CM | POA: Diagnosis not present

## 2017-09-10 DIAGNOSIS — F1010 Alcohol abuse, uncomplicated: Secondary | ICD-10-CM | POA: Diagnosis not present

## 2017-09-10 DIAGNOSIS — E8809 Other disorders of plasma-protein metabolism, not elsewhere classified: Secondary | ICD-10-CM

## 2017-09-10 DIAGNOSIS — F32A Depression, unspecified: Secondary | ICD-10-CM

## 2017-09-10 DIAGNOSIS — R609 Edema, unspecified: Secondary | ICD-10-CM

## 2017-09-10 LAB — COMPREHENSIVE METABOLIC PANEL
ALBUMIN: 3.2 g/dL — AB (ref 3.5–5.2)
ALT: 10 U/L (ref 0–35)
AST: 21 U/L (ref 0–37)
Alkaline Phosphatase: 129 U/L — ABNORMAL HIGH (ref 39–117)
BILIRUBIN TOTAL: 0.5 mg/dL (ref 0.2–1.2)
BUN: 3 mg/dL — ABNORMAL LOW (ref 6–23)
CALCIUM: 9.1 mg/dL (ref 8.4–10.5)
CO2: 28 mEq/L (ref 19–32)
CREATININE: 0.56 mg/dL (ref 0.40–1.20)
Chloride: 103 mEq/L (ref 96–112)
GFR: 118.71 mL/min (ref >60.00)
Glucose, Bld: 133 mg/dL — ABNORMAL HIGH (ref 70–99)
Potassium: 4.6 mEq/L (ref 3.5–5.1)
Sodium: 137 mEq/L (ref 135–145)
Total Protein: 6.3 g/dL (ref 6.0–8.3)

## 2017-09-10 MED ORDER — SERTRALINE HCL 50 MG PO TABS: 50.0000 mg | ORAL_TABLET | Freq: Every day | ORAL | 2 refills | Status: DC

## 2017-09-10 NOTE — Progress Notes (Signed)
Subjective:    Patient ID: Kari Hoffman, female    DOB: 08-08-1960, 57 y.o.   MRN: [id number]  HPI  Kari Hoffman is a 57 yr old female who presents today for follow up. She is accompanied today by her husband.   Depression- last visit we discussed beginning zoloft. She reports mood is stable.  She notes some diarrhea (3 times a day) for 3 weeks. She attributes the diarrhea to the zoloft but does not feel that it is bad enough to consider discontinuation of zoloft.  Husband notes that "she seems to be getting back to her old self."    Alcohol abuse- reports that she had 4 beers yesterday. Reports that her alcohol consumption varies based on how her day is going.   Edema- reports significant improvement in her LE edema.  Review of Systems    see HPI  Past Medical History:  Diagnosis Date  . Atrial flutter (HCC)   . Depression   . Ejection fraction   . H/O alcohol abuse   . Low back pain   . Multiple thyroid nodules      Social History   Socioeconomic History  . Marital status: Married    Spouse name: Not on file  . Number of children: 2  . Years of education: Not on file  . Highest education level: Not on file  Occupational History  . Occupation: Designer, television/film set  . Financial resource strain: Not on file  . Food insecurity:    Worry: Not on file    Inability: Not on file  . Transportation needs:    Medical: Not on file    Non-medical: Not on file  Tobacco Use  . Smoking status: Current Some Day Smoker    Packs/day: 1.00    Last attempt to quit: 07/10/2011    Years since quitting: 6.1  . Smokeless tobacco: Never Used  . Tobacco comment: smoking since 1986  Substance and Sexual Activity  . Alcohol use: Yes    Comment: socially  . Drug use: Not on file  . Sexual activity: Not on file  Lifestyle  . Physical activity:    Days per week: Not on file    Minutes per session: Not on file  . Stress: Not on file  Relationships  . Social connections:    Talks on  phone: Not on file    Gets together: Not on file    Attends religious service: Not on file    Active member of club or organization: Not on file    Attends meetings of clubs or organizations: Not on file    Relationship status: Not on file  . Intimate partner violence:    Fear of current or ex partner: Not on file    Emotionally abused: Not on file    Physically abused: Not on file    Forced sexual activity: Not on file  Other Topics Concern  . Not on file  Social History Narrative   Married 13 yrs (2nd marriage)   Works at [employer]    Past Surgical History:  Procedure Laterality Date  . ABDOMINAL HYSTERECTOMY  1989   left oophorectomy  . ANTERIOR CERVICAL DISCECTOMY    . C5- C6, C6- C7 anterior cervical diskectomy and fusion with allograft and anterir plating    . scar tissue removal   2004    Family History  Problem Relation Age of Onset  . Cancer Mother  bone  . Leukemia Father   . Diabetes Brother        type 2  . Coronary artery disease Neg Hx     No Known Allergies  Current Outpatient Medications on File Prior to Visit  Medication Sig Dispense Refill  . folic acid (FOLVITE) 1 MG tablet Take 1 tablet (1 mg total) by mouth daily. 30 tablet 2  . thiamine (VITAMIN B-1) 100 MG tablet Take 1 tablet (100 mg total) by mouth daily. 30 tablet 2   No current facility-administered medications on file prior to visit.     BP 123/86 (BP Location: Right Arm, Patient Position: Sitting, Cuff Size: Small)   Pulse (!) 101   Temp 97.9 F (36.6 C) (Oral)   Resp 16   Ht  (1.676 m)   Wt 126 lb 3.2 oz (57.2 kg)   SpO2 100%   BMI 20.37 kg/m    Objective:   Physical Exam  Constitutional: She is oriented to person, place, and time. She appears well-developed and well-nourished.  Cardiovascular: Normal rate, regular rhythm and normal heart sounds.  No murmur heard. Pulmonary/Chest: Effort normal and breath sounds normal. No respiratory distress. She has no  wheezes.  Musculoskeletal:  Trace bilateral LE edema  Neurological: She is alert and oriented to person, place, and time.  Psychiatric: She has a normal mood and affect. Her behavior is normal. Judgment and thought content normal.          Assessment & Plan:  LE edema- improved. Monitor.  ETOH abuse- counseled pt on importance of alcohol cessation. She does not appear motivated to quit at this time.  Hypoalbuminemia- check follow up serum protein/albumin.   Depression- scored 12 on PHQ-9. Denies SI. Continue current dose of zoloft for now. I advised the patient to call if her diarrhea symptoms worsen.

## 2017-09-11 ENCOUNTER — Other Ambulatory Visit: Payer: Self-pay

## 2017-09-11 DIAGNOSIS — R739 Hyperglycemia, unspecified: Secondary | ICD-10-CM

## 2017-09-11 DIAGNOSIS — R748 Abnormal levels of other serum enzymes: Secondary | ICD-10-CM

## 2017-09-22 ENCOUNTER — Encounter: Payer: Self-pay | Admitting: *Deleted

## 2017-10-08 ENCOUNTER — Other Ambulatory Visit: Payer: Self-pay | Admitting: Family

## 2017-10-15 ENCOUNTER — Ambulatory Visit: Payer: 59 | Admitting: Family

## 2017-11-19 ENCOUNTER — Other Ambulatory Visit: Payer: Self-pay

## 2017-11-19 ENCOUNTER — Emergency Department (HOSPITAL_BASED_OUTPATIENT_CLINIC_OR_DEPARTMENT_OTHER): Payer: 59

## 2017-11-19 ENCOUNTER — Emergency Department (HOSPITAL_BASED_OUTPATIENT_CLINIC_OR_DEPARTMENT_OTHER)
Admission: EM | Admit: 2017-11-19 | Discharge: 2017-11-19 | Disposition: A | Discharge status: Home / Self Care | Payer: 59 | Attending: Emergency Medicine | Admitting: Emergency Medicine

## 2017-11-19 ENCOUNTER — Ambulatory Visit: Payer: 59 | Admitting: Family

## 2017-11-19 ENCOUNTER — Encounter (HOSPITAL_BASED_OUTPATIENT_CLINIC_OR_DEPARTMENT_OTHER): Payer: Self-pay

## 2017-11-19 DIAGNOSIS — Z79899 Other long term (current) drug therapy: Secondary | ICD-10-CM | POA: Insufficient documentation

## 2017-11-19 DIAGNOSIS — F17200 Nicotine dependence, unspecified, uncomplicated: Secondary | ICD-10-CM | POA: Diagnosis not present

## 2017-11-19 DIAGNOSIS — R413 Other amnesia: Secondary | ICD-10-CM

## 2017-11-19 DIAGNOSIS — F1010 Alcohol abuse, uncomplicated: Secondary | ICD-10-CM | POA: Diagnosis not present

## 2017-11-19 DIAGNOSIS — G3184 Mild cognitive impairment, so stated: Secondary | ICD-10-CM | POA: Insufficient documentation

## 2017-11-19 DIAGNOSIS — R109 Unspecified abdominal pain: Secondary | ICD-10-CM | POA: Diagnosis not present

## 2017-11-19 DIAGNOSIS — R1013 Epigastric pain: Secondary | ICD-10-CM | POA: Diagnosis not present

## 2017-11-19 LAB — CBC WITH DIFFERENTIAL/PLATELET
Basophils Absolute: 0.1 10*3/uL (ref 0.0–0.1)
Basophils Relative: 1 %
EOS ABS: 0.2 10*3/uL (ref 0.0–0.7)
Eosinophils Relative: 3 %
HCT: 50.2 % — ABNORMAL HIGH (ref 36.0–46.0)
HEMOGLOBIN: 17.3 g/dL — AB (ref 12.0–15.0)
LYMPHS ABS: 2.5 10*3/uL (ref 0.7–4.0)
Lymphocytes Relative: 34 %
MCH: 31.4 pg (ref 26.0–34.0)
MCHC: 34.5 g/dL (ref 30.0–36.0)
MCV: 91.1 fL (ref 78.0–100.0)
MONOS PCT: 13 %
Monocytes Absolute: 1 10*3/uL (ref 0.1–1.0)
NEUTROS ABS: 3.6 10*3/uL (ref 1.7–7.7)
NEUTROS PCT: 49 %
Platelets: 170 10*3/uL (ref 150–400)
RBC: 5.51 MIL/uL — ABNORMAL HIGH (ref 3.87–5.11)
RDW: 14.4 % (ref 11.5–15.5)
WBC: 7.3 10*3/uL (ref 4.0–10.5)

## 2017-11-19 LAB — COMPREHENSIVE METABOLIC PANEL
ALK PHOS: 109 U/L (ref 38–126)
ALT: 41 U/L (ref 0–44)
ANION GAP: 9 (ref 5–15)
AST: 48 U/L — ABNORMAL HIGH (ref 15–41)
Albumin: 3.4 g/dL — ABNORMAL LOW (ref 3.5–5.0)
BUN: 6 mg/dL (ref 6–20)
CALCIUM: 8.7 mg/dL — AB (ref 8.9–10.3)
CO2: 27 mmol/L (ref 22–32)
Chloride: 96 mmol/L — ABNORMAL LOW (ref 98–111)
Creatinine, Ser: 0.6 mg/dL (ref 0.44–1.00)
GFR calc Af Amer: >60 mL/min (ref >60)
Glucose, Bld: 97 mg/dL (ref 70–99)
Potassium: 3.8 mmol/L (ref 3.5–5.1)
Sodium: 132 mmol/L — ABNORMAL LOW (ref 135–145)
Total Bilirubin: 0.2 mg/dL — ABNORMAL LOW (ref 0.3–1.2)
Total Protein: 7.4 g/dL (ref 6.5–8.1)

## 2017-11-19 LAB — URINALYSIS, ROUTINE W REFLEX MICROSCOPIC
BILIRUBIN URINE: NEGATIVE
Glucose, UA: NEGATIVE mg/dL
Hgb urine dipstick: NEGATIVE
KETONES UR: NEGATIVE mg/dL
Leukocytes, UA: NEGATIVE
NITRITE: NEGATIVE
PH: 5 (ref 5.0–8.0)
Protein, ur: NEGATIVE mg/dL
Specific Gravity, Urine: <1.005 — ABNORMAL LOW (ref 1.005–1.030)

## 2017-11-19 LAB — LIPASE, BLOOD: Lipase: 67 U/L — ABNORMAL HIGH (ref 11–51)

## 2017-11-19 LAB — ETHANOL: ALCOHOL ETHYL (B): 69 mg/dL — AB (ref <10)

## 2017-11-19 LAB — TROPONIN I: Troponin I: <0.03 ng/mL (ref <0.03)

## 2017-11-19 MED ORDER — SODIUM CHLORIDE 0.9 % IV BOLUS
1000.0000 mL | Freq: Once | INTRAVENOUS | Status: AC
  Administered 2017-11-19: 1000 mL via INTRAVENOUS

## 2017-11-19 MED ORDER — OMEPRAZOLE 20 MG PO CPDR: 20.0000 mg | DELAYED_RELEASE_CAPSULE | Freq: Every day | ORAL | 0 refills | Status: DC

## 2017-11-19 MED ORDER — VITAMIN B-1 100 MG PO TABS
100.0000 mg | ORAL_TABLET | Freq: Once | ORAL | Status: AC
  Administered 2017-11-19: 100 mg via ORAL
  Filled 2017-11-19: qty 1

## 2017-11-19 MED ORDER — ONDANSETRON HCL 4 MG/2ML IJ SOLN
4.0000 mg | Freq: Once | INTRAMUSCULAR | Status: AC
  Administered 2017-11-19: 4 mg via INTRAVENOUS
  Filled 2017-11-19: qty 2

## 2017-11-19 NOTE — ED Notes (Signed)
ED Provider at bedside. 

## 2017-11-19 NOTE — ED Notes (Signed)
Patient transported to X-ray 

## 2017-11-19 NOTE — Discharge Instructions (Addendum)
Patient was evaluated today for abdominal and chest pain as well as concerns for not taking her medications as directed.  It is important that you take your medications as directed.  You can overdose on Zoloft.  Most of your lab work-up is largely reassuring.  You have mild elevation in her LFTs as well as your lipase.  This is likely a reflection of your chronic alcohol use.  Follow-up with your primary physician.  He will be given omeprazole for presumed gastritis.

## 2017-11-19 NOTE — ED Provider Notes (Signed)
[facility] EMERGENCY DEPARTMENT Provider Note   CSN: [id number] Arrival date & time: 11/19/17  0114     History   Chief Complaint Chief Complaint  Patient presents with  . Drug Overdose  . Abdominal Pain    HPI Kari Hoffman is a 57 y.o. female.  HPI  This is a 57 year old female with a history of alcohol abuse, depression who presents with abdominal pain and shoulder pain.  Also reports diarrhea.  Patient reports 1 year history of abdominal and shoulder pain.  It has been ongoing and progressive.  Nothing seems to make it better or worse.  Patient will not really elaborate on her symptoms.  She has had some diarrhea.  No vomiting.  Patient reports "drinking for my pain."  She reports drinking one half case to 1 case of beer daily.  This is been ongoing for years.  Husband reports that she takes her medications very irregularly.  He thinks she may have taken too much Zoloft today.  Patient reports that she may be took 3 Zoloft today.  She reports "I took him because I was in pain."  Husband reports that he feels that she takes likely to daily.  She has prescribed 50 mg once daily.  They report going to the hospital in April for memory difficulties.  Husband reports that memory difficulties were blamed on her alcohol abuse.  She was discharged home with thiamine and folic acid.  He is unsure whether she has been taking these.  She denies any recent fevers, cough, shortness of breath.  She currently rates her pain an 8 out of 10.  Past Medical History:  Diagnosis Date  . Atrial flutter (HCC)   . Depression   . Ejection fraction   . H/O alcohol abuse   . Low back pain   . Multiple thyroid nodules     Patient Active Problem List   Diagnosis Date Noted  . Altered mental status 08/05/2017  . Acute encephalopathy 08/05/2017  . Routine general medical examination at a health care facility 12/13/2013  . H/O alcohol abuse   . Ejection fraction   . Depression 03/16/2013  .  Weight gain 03/16/2013  . Multiple thyroid nodules 03/16/2013  . Low back pain 03/16/2013  . Atrial flutter (HCC) 03/10/2013  . Alcohol abuse 03/10/2013  . Screening for hyperlipidemia 12/15/2012  . TOBACCO ABUSE 02/08/2010    Past Surgical History:  Procedure Laterality Date  . ABDOMINAL HYSTERECTOMY  1989   left oophorectomy  . ANTERIOR CERVICAL DISCECTOMY    . C5- C6, C6- C7 anterior cervical diskectomy and fusion with allograft and anterir plating    . scar tissue removal   2004     OB History   None      Home Medications    Prior to Admission medications   Medication Sig Start Date End Date Taking? Authorizing Provider  folic acid (FOLVITE) 1 MG tablet Take 1 tablet (1 mg total) by mouth daily. 08/09/17   Meredeth Ide, MD  omeprazole (PRILOSEC) 20 MG capsule Take 1 capsule (20 mg total) by mouth daily. 11/19/17   Ashrith Sagan, Mayer Masker, MD  sertraline (ZOLOFT) 50 MG tablet TAKE 1 TABLET BY MOUTH EVERY DAY 10/08/17   Sandford Craze, NP  thiamine (VITAMIN B-1) 100 MG tablet Take 1 tablet (100 mg total) by mouth daily. 08/08/17   Meredeth Ide, MD    Family History Family History  Problem Relation Age of Onset  . Cancer  Mother        bone  . Leukemia Father   . Diabetes Brother        type 2  . Coronary artery disease Neg Hx     Social History Social History   Tobacco Use  . Smoking status: Current Some Day Smoker    Packs/day: 1.00    Last attempt to quit: 07/10/2011    Years since quitting: 6.3  . Smokeless tobacco: Never Used  . Tobacco comment: smoking since 1986  Substance Use Topics  . Alcohol use: Yes    Comment: socially  . Drug use: Never     Allergies   Patient has no known allergies.   Review of Systems Review of Systems  Constitutional: Negative for fever.  Respiratory: Negative for shortness of breath.   Cardiovascular: Negative for chest pain.  Gastrointestinal: Positive for abdominal pain and diarrhea. Negative for nausea and  vomiting.  Genitourinary: Negative for dysuria.  Psychiatric/Behavioral: Positive for confusion.  All other systems reviewed and are negative.    Physical Exam Updated Vital Signs BP 128/88 (BP Location: Right Arm)   Pulse 70   Temp 98 F (36.7 C) (Oral)   Resp (!) 21   Ht 5\' 6"  (1.676 m)   Wt 57.2 kg (126 lb)   SpO2 100%   BMI 20.34 kg/m   Physical Exam  Constitutional: She is oriented to person, place, and time.  Chronically ill-appearing, flat affect, no acute distress  HENT:  Head: Normocephalic and atraumatic.  Mucous membranes dry  Neck: Neck supple.  Cardiovascular: Normal rate, regular rhythm and normal heart sounds.  Pulmonary/Chest: Effort normal. No respiratory distress. She has no wheezes.  Abdominal: Soft. Bowel sounds are normal. There is tenderness in the epigastric area. There is no rebound and no guarding.  Neurological: She is alert and oriented to person, place, and time.  Oriented x3, at times answers questions inappropriately, 5 out of 5 strength in all 4 extremities, 2+ equal patellar reflexes bilaterally  Skin: Skin is warm and dry.  Psychiatric:  Flat and withdrawn  Nursing note and vitals reviewed.    ED Treatments / Results  Labs (all labs ordered are listed, but only abnormal results are displayed) Labs Reviewed  CBC WITH DIFFERENTIAL/PLATELET - Abnormal; Notable for the following components:      Result Value   RBC 5.51 (*)    Hemoglobin 17.3 (*)    HCT 50.2 (*)    All other components within normal limits  COMPREHENSIVE METABOLIC PANEL - Abnormal; Notable for the following components:   Sodium 132 (*)    Chloride 96 (*)    Calcium 8.7 (*)    Albumin 3.4 (*)    AST 48 (*)    Total Bilirubin 0.2 (*)    All other components within normal limits  LIPASE, BLOOD - Abnormal; Notable for the following components:   Lipase 67 (*)    All other components within normal limits  URINALYSIS, ROUTINE W REFLEX MICROSCOPIC - Abnormal; Notable  for the following components:   Specific Gravity, Urine <1.005 (*)    All other components within normal limits  ETHANOL - Abnormal; Notable for the following components:   Alcohol, Ethyl (B) 69 (*)    All other components within normal limits  TROPONIN I    EKG EKG Interpretation  Date/Time:  Wednesday November 19 2017 01:42:30 EDT Ventricular Rate:  74 PR Interval:    QRS Duration: 92 QT Interval:  423 QTC Calculation:  470 R Axis:   87 Text Interpretation:  Sinus rhythm Biatrial enlargement Minimal ST elevation, inferior leads Confirmed by Ross MarcusHorton, Jaelle Campanile ([id number]) on 11/19/2017 3:00:33 AM   Radiology Dg Abdomen Acute W/chest  Result Date: 11/19/2017 CLINICAL DATA:  Chest and abdominal pain. EXAM: DG ABDOMEN ACUTE W/ 1V CHEST COMPARISON:  Abdominal CT 08/05/2017 FINDINGS: The cardiomediastinal contours are normal. The lungs are clear. There is no free intra-abdominal air. No dilated bowel loops to suggest obstruction. Small volume of stool throughout the colon. No radiopaque calculi. Scoliotic curvature of the thoracolumbar spine. No acute osseous abnormalities are seen. IMPRESSION: Negative abdominal radiographs.  No acute cardiopulmonary disease. Scoliosis. Electronically Signed   By: Rubye OaksMelanie  Ehinger M.D.   On: 11/19/2017 02:54    Procedures Procedures (including critical care time)  Medications Ordered in ED Medications  sodium chloride 0.9 % bolus 1,000 mL (1,000 mLs Intravenous New Bag/Given 11/19/17 0215)  thiamine (VITAMIN B-1) tablet 100 mg (100 mg Oral Given 11/19/17 0216)  ondansetron (ZOFRAN) injection 4 mg (4 mg Intravenous Given 11/19/17 0215)     Initial Impression / Assessment and Plan / ED Course  I have reviewed the triage vital signs and the nursing notes.  Pertinent labs & imaging results that were available during my care of the patient were reviewed by me and considered in my medical decision making (see chart for details).     She presents with her  husband with acute on chronic pain and alcohol abuse.  She is nontoxic-appearing on exam.  Vital signs are reassuring.  She is very flat and does not contribute much to history taking.  At times she appears confused.  Husband states that this is an ongoing issue.  She is a daily drinker.  She does clinically appear somewhat dry.  Otherwise her exam is only notable for some mild epigastric tenderness.  EKG shows no evidence of arrhythmia.  Acute abdominal series is negative.  Troponin is negative.  Doubt ACS.  Lab work-up is notable for mild elevation in LFTs and lipase.  This is consistent with her prior.  Patient is not showing any signs or symptoms of serotonin syndrome.  She is prescribed 50 mg of Zoloft daily.  Reports max dosage of 150 mg daily.  This is not a toxic range.  This could be contributing to her diarrhea however.  I discussed the results with the patient and her husband.  She has a primary care appointment later this afternoon.  Suspect much of her presentation is likely related to alcohol use.  Her abdominal pain could be gastritis or peptic ulcers.  Doubt pancreatitis.  Will start on omeprazole.  I have requested that her husband manage her medications for her.  He is agreeable with plan.  I discussed with the patient that I felt that many of her symptoms were alcohol related including her memory deficits.  Continue thiamine.  She is not ready to quit drinking.  After history, exam, and medical workup I feel the patient has been appropriately medically screened and is safe for discharge home. Pertinent diagnoses were discussed with the patient. Patient was given return precautions.   Final Clinical Impressions(s) / ED Diagnoses   Final diagnoses:  Epigastric pain  Alcohol abuse  Memory difficulties    ED Discharge Orders        Ordered    omeprazole (PRILOSEC) 20 MG capsule  Daily     11/19/17 0315       Shon BatonHorton, Crestina Strike F, MD 11/19/17 [phone]

## 2017-11-19 NOTE — ED Notes (Signed)
Pt and family understood dc material. NAD noted. Script given at dc 

## 2017-11-19 NOTE — ED Triage Notes (Addendum)
Pt states she is in pain so she accidentally took her zoloft three times today when she is prescribed to take it once per day. Pt states she has been having abd pain, and pain in her shoulders. Pt also endorses diarrhea. Pt is sluggish in appearance during triage. At end of exam pt states she also has been drinking ETOH for the pain and endorses "half a case."

## 2017-11-23 ENCOUNTER — Other Ambulatory Visit: Payer: Self-pay

## 2017-11-23 ENCOUNTER — Emergency Department (HOSPITAL_BASED_OUTPATIENT_CLINIC_OR_DEPARTMENT_OTHER): Payer: 59

## 2017-11-23 ENCOUNTER — Emergency Department (HOSPITAL_BASED_OUTPATIENT_CLINIC_OR_DEPARTMENT_OTHER)
Admission: EM | Admit: 2017-11-23 | Discharge: 2017-11-23 | Disposition: A | Discharge status: Home / Self Care | Payer: 59 | Attending: Emergency Medicine | Admitting: Emergency Medicine

## 2017-11-23 ENCOUNTER — Encounter (HOSPITAL_BASED_OUTPATIENT_CLINIC_OR_DEPARTMENT_OTHER): Payer: Self-pay | Admitting: *Deleted

## 2017-11-23 DIAGNOSIS — F17200 Nicotine dependence, unspecified, uncomplicated: Secondary | ICD-10-CM | POA: Insufficient documentation

## 2017-11-23 DIAGNOSIS — K5900 Constipation, unspecified: Secondary | ICD-10-CM | POA: Insufficient documentation

## 2017-11-23 DIAGNOSIS — R1084 Generalized abdominal pain: Secondary | ICD-10-CM | POA: Diagnosis present

## 2017-11-23 DIAGNOSIS — Z79899 Other long term (current) drug therapy: Secondary | ICD-10-CM | POA: Insufficient documentation

## 2017-11-23 LAB — BASIC METABOLIC PANEL
ANION GAP: 9 (ref 5–15)
BUN: 5 mg/dL — ABNORMAL LOW (ref 6–20)
CO2: 26 mmol/L (ref 22–32)
Calcium: 8.5 mg/dL — ABNORMAL LOW (ref 8.9–10.3)
Chloride: 96 mmol/L — ABNORMAL LOW (ref 98–111)
Creatinine, Ser: 0.43 mg/dL — ABNORMAL LOW (ref 0.44–1.00)
Glucose, Bld: 81 mg/dL (ref 70–99)
POTASSIUM: 3.6 mmol/L (ref 3.5–5.1)
Sodium: 131 mmol/L — ABNORMAL LOW (ref 135–145)

## 2017-11-23 MED ORDER — GI COCKTAIL ~~LOC~~
30.0000 mL | Freq: Once | ORAL | Status: AC
Start: 2017-11-23 — End: 2017-11-23
  Administered 2017-11-23: 30 mL via ORAL
  Filled 2017-11-23: qty 30

## 2017-11-23 MED ORDER — ACETAMINOPHEN 500 MG PO TABS
1000.0000 mg | ORAL_TABLET | Freq: Once | ORAL | Status: AC
  Administered 2017-11-23: 1000 mg via ORAL
  Filled 2017-11-23: qty 2

## 2017-11-23 NOTE — ED Provider Notes (Signed)
[facility] EMERGENCY DEPARTMENT Provider Note   CSN: [id number] Arrival date & time: 11/23/17  0107     History   Chief Complaint Chief Complaint  Patient presents with  . constipated    HPI Kari Hoffman is a 57 y.o. female.  The history is provided by the patient and the spouse.  Constipation   This is a new problem. The current episode started more than 2 days ago. Stool description: none. Associated symptoms comments: cramping. There is no fiber in the patient's diet. She does not exercise regularly. There has not been adequate water intake. She has tried stimulants and osmotic agents for the symptoms. Risk factors: alcohol abuse. Her past medical history does not include endocrine disease or metabolic disease.  Patient with known alcohol abuse presents with Constipation.  Was seen with diarrhea and hasn't been having BM, so the patient did 2 enemas and took dulcolax and now has cramping without stool.  She also drank 6 beers to try to help with this this evening.    Past Medical History:  Diagnosis Date  . Atrial flutter (HCC)   . Depression   . Ejection fraction   . H/O alcohol abuse   . Low back pain   . Multiple thyroid nodules     Patient Active Problem List   Diagnosis Date Noted  . Altered mental status 08/05/2017  . Acute encephalopathy 08/05/2017  . Routine general medical examination at a health care facility 12/13/2013  . H/O alcohol abuse   . Ejection fraction   . Depression 03/16/2013  . Weight gain 03/16/2013  . Multiple thyroid nodules 03/16/2013  . Low back pain 03/16/2013  . Atrial flutter (HCC) 03/10/2013  . Alcohol abuse 03/10/2013  . Screening for hyperlipidemia 12/15/2012  . TOBACCO ABUSE 02/08/2010    Past Surgical History:  Procedure Laterality Date  . ABDOMINAL HYSTERECTOMY  1989   left oophorectomy  . ANTERIOR CERVICAL DISCECTOMY    . C5- C6, C6- C7 anterior cervical diskectomy and fusion with allograft and anterir plating     . scar tissue removal   2004     OB History   None      Home Medications    Prior to Admission medications   Medication Sig Start Date End Date Taking? Authorizing Provider  folic acid (FOLVITE) 1 MG tablet Take 1 tablet (1 mg total) by mouth daily. 08/09/17   Meredeth IdeLama, Gagan S, MD  omeprazole (PRILOSEC) 20 MG capsule Take 1 capsule (20 mg total) by mouth daily. 11/19/17   Horton, Mayer Maskerourtney F, MD  sertraline (ZOLOFT) 50 MG tablet TAKE 1 TABLET BY MOUTH EVERY DAY 10/08/17   Sandford Craze'Sullivan, [name], NP  thiamine (VITAMIN B-1) 100 MG tablet Take 1 tablet (100 mg total) by mouth daily. 08/08/17   Meredeth IdeLama, Gagan S, MD    Family History Family History  Problem Relation Age of Onset  . Cancer Mother        bone  . Leukemia Father   . Diabetes Brother        type 2  . Coronary artery disease Neg Hx     Social History Social History   Tobacco Use  . Smoking status: Current Every Day Smoker    Packs/day: 1.00    Last attempt to quit: 07/10/2011    Years since quitting: 6.3  . Smokeless tobacco: Never Used  . Tobacco comment: smoking since 1986  Substance Use Topics  . Alcohol use: Yes    Comment:  daily   . Drug use: Never     Allergies   Patient has no known allergies.   Review of Systems Review of Systems  Constitutional: Negative for fever.  Respiratory: Negative for shortness of breath.   Cardiovascular: Negative for chest pain.  Gastrointestinal: Positive for constipation. Negative for abdominal distention and vomiting.  All other systems reviewed and are negative.    Physical Exam Updated Vital Signs BP 126/81 (BP Location: Right Arm)   Pulse 71   Temp 97.7 F (36.5 C) (Oral)   Resp 16   Ht 5\' 6"  (1.676 m)   Wt 59 kg (130 lb)   SpO2 100%   BMI 20.98 kg/m   Physical Exam  Constitutional: She is oriented to person, place, and time. She appears well-developed and well-nourished. No distress.  HENT:  Head: Normocephalic and atraumatic.  Nose: Nose normal.    Mouth/Throat: No oropharyngeal exudate.  Eyes: Pupils are equal, round, and reactive to light. Conjunctivae are normal.  Neck: Normal range of motion. Neck supple.  Cardiovascular: Normal rate, regular rhythm, normal heart sounds and intact distal pulses.  Pulmonary/Chest: Effort normal and breath sounds normal. No stridor. She has no wheezes. She has no rales.  Abdominal: Soft. She exhibits no mass. There is no tenderness. There is no rebound and no guarding.  gassy  Musculoskeletal: Normal range of motion.  Neurological: She is alert and oriented to person, place, and time. She displays normal reflexes.  Skin: Skin is warm and dry. Capillary refill takes less than 2 seconds.  Psychiatric: She has a normal mood and affect.     ED Treatments / Results  Labs (all labs ordered are listed, but only abnormal results are displayed) Labs Reviewed  BASIC METABOLIC PANEL    EKG None  Radiology No results found.  Procedures Procedures (including critical care time)  Medications Ordered in ED Medications  gi cocktail (Maalox,Lidocaine,Donnatal) (30 mLs Oral Given 11/23/17 0148)  acetaminophen (TYLENOL) tablet 1,000 mg (1,000 mg Oral Given 11/23/17 0148)       Final Clinical Impressions(s) / ED Diagnoses   Stop alcohol.  Eat a diet high in fiber.  Miralax therapy for constipation.  Liquid stool in the colon consistent with recent laxative use.  No electrolyte abnormalities.  Stable for discharge.  Return for pain, numbness, changes in vision or speech, fevers >100.4 unrelieved by medication, shortness of breath, intractable vomiting, or diarrhea, abdominal pain, Inability to tolerate liquids or food, cough, altered mental status or any concerns. No signs of systemic illness or infection. The patient is nontoxic-appearing on exam and vital signs are within normal limits. Will refer to urology for microscopy hematuria as patient is asymptomatic.  I have reviewed the triage vital  signs and the nursing notes. Pertinent labs &imaging results that were available during my care of the patient were reviewed by me and considered in my medical decision making (see chart for details).  After history, exam, and medical workup I feel the patient has been appropriately medically screened and is safe for discharge home. Pertinent diagnoses were discussed with the patient. Patient was given return precautions.   Carriann Hesse, MD 11/23/17 [phone]

## 2017-11-23 NOTE — ED Triage Notes (Addendum)
Pt c/o of general abd pain and cramping and  states she has taken several otc meds such as ex lax, enemas over the past 2 days with no results. States she has been drinking etoh as well, states she has had approx 6 beers since 4pm.

## 2017-11-23 NOTE — ED Notes (Signed)
Patient transported to X-ray 

## 2017-11-23 NOTE — Discharge Instructions (Addendum)
Take miralax one capful a day for constipation

## 2017-12-20 ENCOUNTER — Emergency Department (HOSPITAL_BASED_OUTPATIENT_CLINIC_OR_DEPARTMENT_OTHER)
Admission: EM | Admit: 2017-12-20 | Discharge: 2017-12-20 | Disposition: A | Discharge status: Home / Self Care | Payer: 59 | Attending: Emergency Medicine | Admitting: Emergency Medicine

## 2017-12-20 ENCOUNTER — Other Ambulatory Visit: Payer: Self-pay

## 2017-12-20 ENCOUNTER — Emergency Department (HOSPITAL_BASED_OUTPATIENT_CLINIC_OR_DEPARTMENT_OTHER): Payer: 59

## 2017-12-20 ENCOUNTER — Encounter (HOSPITAL_BASED_OUTPATIENT_CLINIC_OR_DEPARTMENT_OTHER): Payer: Self-pay | Admitting: *Deleted

## 2017-12-20 DIAGNOSIS — F17210 Nicotine dependence, cigarettes, uncomplicated: Secondary | ICD-10-CM | POA: Insufficient documentation

## 2017-12-20 DIAGNOSIS — R109 Unspecified abdominal pain: Secondary | ICD-10-CM

## 2017-12-20 DIAGNOSIS — R1011 Right upper quadrant pain: Secondary | ICD-10-CM | POA: Diagnosis not present

## 2017-12-20 DIAGNOSIS — Z79899 Other long term (current) drug therapy: Secondary | ICD-10-CM | POA: Insufficient documentation

## 2017-12-20 DIAGNOSIS — R05 Cough: Secondary | ICD-10-CM | POA: Diagnosis not present

## 2017-12-20 DIAGNOSIS — M549 Dorsalgia, unspecified: Secondary | ICD-10-CM | POA: Diagnosis not present

## 2017-12-20 DIAGNOSIS — R1031 Right lower quadrant pain: Secondary | ICD-10-CM | POA: Diagnosis not present

## 2017-12-20 LAB — COMPREHENSIVE METABOLIC PANEL
ALK PHOS: 92 U/L (ref 38–126)
ALT: 22 U/L (ref 0–44)
ANION GAP: 9 (ref 5–15)
AST: 28 U/L (ref 15–41)
Albumin: 3.6 g/dL (ref 3.5–5.0)
BUN: 5 mg/dL — ABNORMAL LOW (ref 6–20)
CALCIUM: 8.9 mg/dL (ref 8.9–10.3)
CO2: 27 mmol/L (ref 22–32)
CREATININE: 0.59 mg/dL (ref 0.44–1.00)
Chloride: 101 mmol/L (ref 98–111)
GFR calc non Af Amer: >60 mL/min (ref >60)
Glucose, Bld: 76 mg/dL (ref 70–99)
Potassium: 3.6 mmol/L (ref 3.5–5.1)
Sodium: 137 mmol/L (ref 135–145)
Total Bilirubin: 0.7 mg/dL (ref 0.3–1.2)
Total Protein: 7 g/dL (ref 6.5–8.1)

## 2017-12-20 LAB — URINALYSIS, ROUTINE W REFLEX MICROSCOPIC
BILIRUBIN URINE: NEGATIVE
Glucose, UA: NEGATIVE mg/dL
KETONES UR: NEGATIVE mg/dL
LEUKOCYTES UA: NEGATIVE
NITRITE: NEGATIVE
PROTEIN: NEGATIVE mg/dL
Specific Gravity, Urine: 1.01 (ref 1.005–1.030)
pH: 5.5 (ref 5.0–8.0)

## 2017-12-20 LAB — CBC WITH DIFFERENTIAL/PLATELET
BASOS ABS: 0 10*3/uL (ref 0.0–0.1)
BASOS PCT: 1 %
EOS ABS: 0.2 10*3/uL (ref 0.0–0.7)
EOS PCT: 3 %
HCT: 47.6 % — ABNORMAL HIGH (ref 36.0–46.0)
Hemoglobin: 16.3 g/dL — ABNORMAL HIGH (ref 12.0–15.0)
Lymphocytes Relative: 31 %
Lymphs Abs: 1.8 10*3/uL (ref 0.7–4.0)
MCH: 32.3 pg (ref 26.0–34.0)
MCHC: 34.2 g/dL (ref 30.0–36.0)
MCV: 94.3 fL (ref 78.0–100.0)
MONOS PCT: 12 %
Monocytes Absolute: 0.7 10*3/uL (ref 0.1–1.0)
NEUTROS PCT: 53 %
Neutro Abs: 3.1 10*3/uL (ref 1.7–7.7)
PLATELETS: 203 10*3/uL (ref 150–400)
RBC: 5.05 MIL/uL (ref 3.87–5.11)
RDW: 14.3 % (ref 11.5–15.5)
WBC: 5.8 10*3/uL (ref 4.0–10.5)

## 2017-12-20 LAB — URINALYSIS, MICROSCOPIC (REFLEX)
RBC / HPF: NONE SEEN RBC/hpf (ref 0–5)
WBC UA: NONE SEEN WBC/hpf (ref 0–5)

## 2017-12-20 LAB — LIPASE, BLOOD: Lipase: 49 U/L (ref 11–51)

## 2017-12-20 MED ORDER — POLYETHYLENE GLYCOL 3350 17 G PO PACK: 17.0000 g | PACK | Freq: Every day | ORAL | 0 refills | Status: DC

## 2017-12-20 MED ORDER — DOCUSATE SODIUM 100 MG PO CAPS: 100.0000 mg | ORAL_CAPSULE | Freq: Two times a day (BID) | ORAL | 0 refills | Status: DC

## 2017-12-20 MED ORDER — ONDANSETRON HCL 4 MG/2ML IJ SOLN
4.0000 mg | Freq: Once | INTRAMUSCULAR | Status: AC
  Administered 2017-12-20: 4 mg via INTRAVENOUS
  Filled 2017-12-20: qty 2

## 2017-12-20 MED ORDER — SODIUM CHLORIDE 0.9 % IV SOLN
INTRAVENOUS | Status: DC
  Administered 2017-12-20: 10:00:00 via INTRAVENOUS

## 2017-12-20 NOTE — ED Triage Notes (Signed)
Pt c/o diffuse abd pain x 2 days

## 2017-12-20 NOTE — ED Provider Notes (Signed)
[facility] EMERGENCY DEPARTMENT Provider Note   CSN: [id number] Arrival date & time: 12/20/17  0935     History   Chief Complaint Chief Complaint  Patient presents with  . Abdominal Pain    HPI Kari Hoffman is a 57 y.o. female.  HPI Patient presents to the emergency room for evaluation of abdominal pain that started a couple days ago.  Patient states she has had pain in her right upper abdomen and back.  Pt is not able to qualify the pain but it is not severe.  She has not noticed anything that seems to make it better or worse.  It does not get better or worse when she eats or drinks.  It also does not get better or worse when she takes a deep breath.  She denies any nausea or vomiting.  She has not had any diarrhea.  She denies any dysuria.  No fevers or chills.  She does cough but she thinks that is related to her smoking.  Patient does have a history of chronic daily alcohol use.  She does not feel like that seems to make it better or worse. Past Medical History:  Diagnosis Date  . Atrial flutter (HCC)   . Depression   . Ejection fraction   . H/O alcohol abuse   . Low back pain   . Multiple thyroid nodules     Patient Active Problem List   Diagnosis Date Noted  . Altered mental status 08/05/2017  . Acute encephalopathy 08/05/2017  . Routine general medical examination at a health care facility 12/13/2013  . H/O alcohol abuse   . Ejection fraction   . Depression 03/16/2013  . Weight gain 03/16/2013  . Multiple thyroid nodules 03/16/2013  . Low back pain 03/16/2013  . Atrial flutter (HCC) 03/10/2013  . Alcohol abuse 03/10/2013  . Screening for hyperlipidemia 12/15/2012  . TOBACCO ABUSE 02/08/2010    Past Surgical History:  Procedure Laterality Date  . ABDOMINAL HYSTERECTOMY  1989   left oophorectomy  . ANTERIOR CERVICAL DISCECTOMY    . C5- C6, C6- C7 anterior cervical diskectomy and fusion with allograft and anterir plating    . scar tissue removal    2004     OB History   None      Home Medications    Prior to Admission medications   Medication Sig Start Date End Date Taking? Authorizing Provider  docusate sodium (COLACE) 100 MG capsule Take 1 capsule (100 mg total) by mouth every 12 (twelve) hours. 12/20/17   Linwood Dibbles, MD  folic acid (FOLVITE) 1 MG tablet Take 1 tablet (1 mg total) by mouth daily. 08/09/17   Meredeth Ide, MD  omeprazole (PRILOSEC) 20 MG capsule Take 1 capsule (20 mg total) by mouth daily. 11/19/17   Horton, Mayer Masker, MD  polyethylene glycol [facility]) packet Take 17 g by mouth daily. 12/20/17   Linwood Dibbles, MD  sertraline (ZOLOFT) 50 MG tablet TAKE 1 TABLET BY MOUTH EVERY DAY 10/08/17   Sandford Craze, NP  thiamine (VITAMIN B-1) 100 MG tablet Take 1 tablet (100 mg total) by mouth daily. 08/08/17   Meredeth Ide, MD    Family History Family History  Problem Relation Age of Onset  . Cancer Mother        bone  . Leukemia Father   . Diabetes Brother        type 2  . Coronary artery disease Neg Hx     Social  History Social History   Tobacco Use  . Smoking status: Current Every Day Smoker    Packs/day: 1.00    Last attempt to quit: 07/10/2011    Years since quitting: 6.4  . Smokeless tobacco: Never Used  . Tobacco comment: smoking since 1986  Substance Use Topics  . Alcohol use: Yes    Comment: daily   . Drug use: Never     Allergies   Patient has no known allergies.   Review of Systems Review of Systems  Gastrointestinal: Positive for constipation.  All other systems reviewed and are negative.    Physical Exam Updated Vital Signs BP (!) 174/89   Pulse (!) 102   Temp 98.7 F (37.1 C)   Resp 16   Ht 1.676 m (5\' 6" )   Wt 58 kg   SpO2 100%   BMI 20.64 kg/m   Physical Exam  Constitutional: No distress.  Appears older than her age  HENT:  Head: Normocephalic and atraumatic.  Right Ear: External ear normal.  Left Ear: External ear normal.  Eyes: Conjunctivae are normal. Right  eye exhibits no discharge. Left eye exhibits no discharge. No scleral icterus.  Neck: Neck supple. No tracheal deviation present.  Cardiovascular: Normal rate, regular rhythm and intact distal pulses.  Pulmonary/Chest: Effort normal and breath sounds normal. No stridor. No respiratory distress. She has no wheezes. She has no rales.  Abdominal: Soft. Bowel sounds are normal. She exhibits no distension. There is tenderness in the right upper quadrant and right lower quadrant. There is no rebound and no guarding. No hernia.  Musculoskeletal: She exhibits no edema or tenderness.  Neurological: She is alert. She has normal strength. No cranial nerve deficit (no facial droop, extraocular movements intact, no slurred speech) or sensory deficit. She exhibits normal muscle tone. She displays no seizure activity. Coordination normal.  Skin: Skin is warm and dry. No rash noted.  Psychiatric: She has a normal mood and affect.  Nursing note and vitals reviewed.    ED Treatments / Results  Labs (all labs ordered are listed, but only abnormal results are displayed) Labs Reviewed  COMPREHENSIVE METABOLIC PANEL - Abnormal; Notable for the following components:      Result Value   BUN 5 (*)    All other components within normal limits  CBC WITH DIFFERENTIAL/PLATELET - Abnormal; Notable for the following components:   Hemoglobin 16.3 (*)    HCT 47.6 (*)    All other components within normal limits  URINALYSIS, ROUTINE W REFLEX MICROSCOPIC - Abnormal; Notable for the following components:   Hgb urine dipstick SMALL (*)    All other components within normal limits  URINALYSIS, MICROSCOPIC (REFLEX) - Abnormal; Notable for the following components:   Bacteria, UA FEW (*)    All other components within normal limits  LIPASE, BLOOD    EKG None  Radiology Dg Chest 2 View  Result Date: 12/20/2017 CLINICAL DATA:  Chronic cough.  Current smoker. EXAM: CHEST - 2 VIEW COMPARISON:  11/23/2017, 11/19/2017,  08/05/2017. FINDINGS: Cardiac silhouette normal in size, unchanged. Thoracic aorta atherosclerotic, unchanged. Hilar and mediastinal contours otherwise unremarkable. Stable mild hyperinflation. Lungs clear. Bronchovascular markings normal. Pulmonary vascularity normal. No visible pleural effusions. No pneumothorax. UPPER thoracic dextroscoliosis. IMPRESSION: Stable mild hyperinflation indicating COPD and/or asthma. No acute cardiopulmonary disease. Electronically Signed   By: Hulan Saashomas  Lawrence M.D.   On: 12/20/2017 10:26    Procedures Procedures (including critical care time)  Medications Ordered in ED Medications  0.9 %  sodium chloride infusion ( Intravenous New Bag/Given 12/20/17 1024)  ondansetron (ZOFRAN) injection 4 mg (4 mg Intravenous Given 12/20/17 1024)     Initial Impression / Assessment and Plan / ED Course  I have reviewed the triage vital signs and the nursing notes.  Pertinent labs & imaging results that were available during my care of the patient were reviewed by me and considered in my medical decision making (see chart for details).  Clinical Course as of Dec 21 1119  Sat Dec 20, 2017  1121 Labs reviewed.  No significant abnormalities.  Chest x-ray without pneumonia.   [JK]    Clinical Course User Index [JK] Linwood Dibbles, MD   Patient presented to the emergency room for evaluation of abdominal pain.  Patient had some mild tenderness on exam but no signs of rebound or guarding.  Laboratory tests are reassuring.  No leukocytosis.  No elevation of the LFTs or lipase.  I discussed the findings with the patient and the options of doing a CT scan for further evaluation versus taking over-the-counter medications over the next day or 2 and to return to the ED if the symptoms have not improved.  Patient states she thinks she could be having some issues with constipation.  The symptoms are not very severe and she is comfortable with coming back to the ER if her symptoms do not improve.   I will give her a prescription for MiraLAX and Colace.  Final Clinical Impressions(s) / ED Diagnoses   Final diagnoses:  Abdominal pain, unspecified abdominal location    ED Discharge Orders         Ordered    polyethylene glycol (MIRALAX) packet  Daily     12/20/17 1118    docusate sodium (COLACE) 100 MG capsule  Every 12 hours     12/20/17 1118           Linwood Dibbles, MD 12/20/17 1121

## 2017-12-20 NOTE — ED Notes (Signed)
Family at bedside. 

## 2017-12-20 NOTE — ED Notes (Signed)
Pt. Very limited with explanation of symptoms.  Pt. Reports her R side is hurting and she has had no diarrhea or vomiting.

## 2017-12-20 NOTE — ED Notes (Signed)
ED Provider at bedside. 

## 2017-12-20 NOTE — Discharge Instructions (Signed)
Take over-the-counter medications as needed for pain, take laxatives as prescribed, return to the emergency room for worsening symptoms, fever, vomiting

## 2018-01-21 ENCOUNTER — Encounter: Payer: Self-pay | Admitting: Neurology

## 2018-01-21 ENCOUNTER — Ambulatory Visit (INDEPENDENT_AMBULATORY_CARE_PROVIDER_SITE_OTHER): Payer: 59 | Admitting: Family

## 2018-01-21 ENCOUNTER — Encounter: Payer: Self-pay | Admitting: Family

## 2018-01-21 VITALS — BP 119/74 | HR 67 | Temp 98.4°F | Resp 16 | Ht 66.0 in | Wt 131.2 lb

## 2018-01-21 DIAGNOSIS — Z87440 Personal history of urinary (tract) infections: Secondary | ICD-10-CM

## 2018-01-21 DIAGNOSIS — R413 Other amnesia: Secondary | ICD-10-CM

## 2018-01-21 DIAGNOSIS — R29818 Other symptoms and signs involving the nervous system: Secondary | ICD-10-CM

## 2018-01-21 LAB — COMPREHENSIVE METABOLIC PANEL
ALK PHOS: 85 U/L (ref 39–117)
ALT: 14 U/L (ref 0–35)
AST: 20 U/L (ref 0–37)
Albumin: 4.2 g/dL (ref 3.5–5.2)
BILIRUBIN TOTAL: 0.7 mg/dL (ref 0.2–1.2)
BUN: 10 mg/dL (ref 6–23)
CO2: 29 mEq/L (ref 19–32)
CREATININE: 0.61 mg/dL (ref 0.40–1.20)
Calcium: 9.5 mg/dL (ref 8.4–10.5)
Chloride: 101 mEq/L (ref 96–112)
GFR: 107.41 mL/min (ref >60.00)
GLUCOSE: 84 mg/dL (ref 70–99)
Potassium: 3.9 mEq/L (ref 3.5–5.1)
SODIUM: 136 meq/L (ref 135–145)
TOTAL PROTEIN: 7 g/dL (ref 6.0–8.3)

## 2018-01-21 LAB — CBC WITH DIFFERENTIAL/PLATELET
BASOS ABS: 0 10*3/uL (ref 0.0–0.1)
Basophils Relative: 0.7 % (ref 0.0–3.0)
EOS ABS: 0.1 10*3/uL (ref 0.0–0.7)
Eosinophils Relative: 2.2 % (ref 0.0–5.0)
HCT: 40.8 % (ref 36.0–46.0)
Hemoglobin: 13.9 g/dL (ref 12.0–15.0)
LYMPHS ABS: 2.2 10*3/uL (ref 0.7–4.0)
Lymphocytes Relative: 38.6 % (ref 12.0–46.0)
MCHC: 34 g/dL (ref 30.0–36.0)
MCV: 94.7 fl (ref 78.0–100.0)
MONO ABS: 0.6 10*3/uL (ref 0.1–1.0)
Monocytes Relative: 11.3 % (ref 3.0–12.0)
Neutro Abs: 2.7 10*3/uL (ref 1.4–7.7)
Neutrophils Relative %: 47.2 % (ref 43.0–77.0)
Platelets: 198 10*3/uL (ref 150.0–400.0)
RBC: 4.31 Mil/uL (ref 3.87–5.11)
RDW: 13.3 % (ref 11.5–15.5)
WBC: 5.6 10*3/uL (ref 4.0–10.5)

## 2018-01-21 LAB — B12 AND FOLATE PANEL
Folate: 7 ng/mL (ref >5.9)
Vitamin B-12: 247 pg/mL (ref 211–911)

## 2018-01-21 LAB — AMMONIA: Ammonia: 26 umol/L (ref 11–35)

## 2018-01-21 LAB — TSH: TSH: 1.36 u[IU]/mL (ref 0.35–4.50)

## 2018-01-21 NOTE — Progress Notes (Addendum)
Subjective:    Patient ID: Kari Hoffman, female    DOB: Sep 21, 1960, 57 y.o.   MRN: [id number]  HPI  Patient presents today for follow up.  Depression- no longer taking zoloft. Denies depression  Memory loss- husband notes that her memory has been off/on since he was hospitalized.  Husband reports that she gets anxious sometimes.  Forgetful. He is not letting her drive.    ETOH abuse- Has an occasional beer.  Has cut down significantly.  Has also cut down on her smoking. Denies family hx of dementia.    Review of Systems See HPI  Past Medical History:  Diagnosis Date  . Atrial flutter (HCC)   . Depression   . Ejection fraction   . H/O alcohol abuse   . Low back pain   . Multiple thyroid nodules      Social History   Socioeconomic History  . Marital status: Married    Spouse name: Not on file  . Number of children: 2  . Years of education: Not on file  . Highest education level: Not on file  Occupational History  . Occupation: Designer, television/film set  . Financial resource strain: Not on file  . Food insecurity:    Worry: Not on file    Inability: Not on file  . Transportation needs:    Medical: Not on file    Non-medical: Not on file  Tobacco Use  . Smoking status: Current Every Day Smoker    Packs/day: 1.00    Last attempt to quit: 07/10/2011    Years since quitting: 6.5  . Smokeless tobacco: Never Used  . Tobacco comment: smoking since 1986  Substance and Sexual Activity  . Alcohol use: Yes    Comment: daily   . Drug use: Never  . Sexual activity: Not on file  Lifestyle  . Physical activity:    Days per week: Not on file    Minutes per session: Not on file  . Stress: Not on file  Relationships  . Social connections:    Talks on phone: Not on file    Gets together: Not on file    Attends religious service: Not on file    Active member of club or organization: Not on file    Attends meetings of clubs or organizations: Not on file    Relationship  status: Not on file  . Intimate partner violence:    Fear of current or ex partner: Not on file    Emotionally abused: Not on file    Physically abused: Not on file    Forced sexual activity: Not on file  Other Topics Concern  . Not on file  Social History Narrative   Married 13 yrs (2nd marriage)   Works at [employer]    Past Surgical History:  Procedure Laterality Date  . ABDOMINAL HYSTERECTOMY  1989   left oophorectomy  . ANTERIOR CERVICAL DISCECTOMY    . C5- C6, C6- C7 anterior cervical diskectomy and fusion with allograft and anterir plating    . scar tissue removal   2004    Family History  Problem Relation Age of Onset  . Cancer Mother        bone  . Leukemia Father   . Diabetes Brother        type 2  . Coronary artery disease Neg Hx     No Known Allergies  Current Outpatient Medications on File Prior to Visit  Medication Sig  Dispense Refill  . docusate sodium (COLACE) 100 MG capsule Take 1 capsule (100 mg total) by mouth every 12 (twelve) hours. 14 capsule 0  . folic acid (FOLVITE) 1 MG tablet Take 1 tablet (1 mg total) by mouth daily. 30 tablet 2  . omeprazole (PRILOSEC) 20 MG capsule Take 1 capsule (20 mg total) by mouth daily. 30 capsule 0  . polyethylene glycol (MIRALAX) packet Take 17 g by mouth daily. 14 each 0  . thiamine (VITAMIN B-1) 100 MG tablet Take 1 tablet (100 mg total) by mouth daily. 30 tablet 2  . sertraline (ZOLOFT) 50 MG tablet TAKE 1 TABLET BY MOUTH EVERY DAY (Patient not taking: Reported on 01/21/2018) 90 tablet 0   No current facility-administered medications on file prior to visit.     BP 119/74 (BP Location: Right Arm, Patient Position: Sitting, Cuff Size: Small)   Pulse 67   Temp 98.4 F (36.9 C) (Oral)   Resp 16   Ht 5\' 6"  (1.676 m)   Wt 131 lb 3.2 oz (59.5 kg)   SpO2 100%   BMI 21.18 kg/m       Objective:   Physical Exam  Constitutional: She appears well-developed and well-nourished. No distress.  Cardiovascular: Normal  rate and regular rhythm.  Pulmonary/Chest: Effort normal.  Musculoskeletal: She exhibits no edema.  Neurological: She is alert. She displays normal reflexes. No cranial nerve deficit. She exhibits normal muscle tone. Coordination normal.  Oriented to person, place, does not know date season or day of week.   Skin: Skin is warm and dry.          Assessment & Plan:  Memory loss- Scored 18 on MMSE.  She defers to her husband on most questions.    Ammonia level, tsh wnl.  B12 low normal- advised for her to add bcomplex tab (see phone note).  Also sent UA/Culture which is pending and will see if lab can add on an RPR.  Obtain CT head to rule out cva.  Refer to neurology for further evaluation.  We discussed that she should not be driving and husband and patient are in agreement about this.

## 2018-01-21 NOTE — Patient Instructions (Signed)
Please complete lab work prior to leaving. You should be contacted about your referral to neurology and for your CT head.

## 2018-01-21 NOTE — Progress Notes (Signed)
   Subjective:    Patient ID: Kari Hoffman, female    DOB: [birth year], 57 y.o.   MRN: [id number]  HPI    Review of Systems     Objective:   Physical Exam        Assessment & Plan:

## 2018-01-22 ENCOUNTER — Telehealth: Payer: Self-pay | Admitting: Family

## 2018-01-22 LAB — URINALYSIS W MICROSCOPIC + REFLEX CULTURE

## 2018-01-22 MED ORDER — B-COMPLEX PO TABS: 1.0000 | ORAL_TABLET | Freq: Every day | ORAL | 0 refills | Status: DC

## 2018-01-22 NOTE — Telephone Encounter (Signed)
Blood work looks good with the exception of low normal b12 level. She can d/c thiamine and add a b complex vitamin once daily.  Please notify pt's husband.

## 2018-01-22 NOTE — Addendum Note (Signed)
Addended by: Harley Alto on: 01/22/2018 03:10 PM   Modules accepted: Orders

## 2018-01-23 LAB — RPR: RPR Ser Ql: NONREACTIVE

## 2018-01-23 NOTE — Telephone Encounter (Signed)
Results given to patient and husband. He will get the b complex today.

## 2018-01-26 NOTE — Addendum Note (Signed)
Addended by: Sandford Craze on: 01/26/2018 12:38 PM   Modules accepted: Orders

## 2018-02-07 ENCOUNTER — Ambulatory Visit (HOSPITAL_BASED_OUTPATIENT_CLINIC_OR_DEPARTMENT_OTHER)
Admission: RE | Admit: 2018-02-07 | Discharge: 2018-02-07 | Disposition: A | Discharge status: Home / Self Care | Payer: 59 | Source: Ambulatory Visit | Attending: Family | Admitting: Family

## 2018-02-07 DIAGNOSIS — R29818 Other symptoms and signs involving the nervous system: Secondary | ICD-10-CM | POA: Diagnosis present

## 2018-02-07 DIAGNOSIS — R51 Headache: Secondary | ICD-10-CM | POA: Diagnosis not present

## 2018-03-25 ENCOUNTER — Encounter: Payer: Self-pay | Admitting: Family

## 2018-03-25 ENCOUNTER — Ambulatory Visit (INDEPENDENT_AMBULATORY_CARE_PROVIDER_SITE_OTHER): Payer: 59 | Admitting: Family

## 2018-03-25 VITALS — BP 142/88 | HR 57 | Temp 97.9°F | Ht 66.0 in | Wt 135.2 lb

## 2018-03-25 DIAGNOSIS — R413 Other amnesia: Secondary | ICD-10-CM

## 2018-03-25 NOTE — Progress Notes (Signed)
Subjective:    Patient ID: Kari Hoffman, female    DOB: July 05, 1960, 57 y.o.   MRN: [id number]  HPI  Patient is a 57 yr old female who presents today for follow up of her memory loss.   Last visit she scored an 18 on Mini-Mental status exam.  We noted normal ammonia level and normal TSH.  B12 was on the low end of normal and she was advised to add a B complex tab.  She was referred to neurology for further evaluation.  MRI of the brain was normal.  She was referred to neurology and has an upcoming appointment on 12/18.  Husband notes that their daughter has recently moved in with them so she has someone with her during the day.  He states pt's memory is essentially unchanged since last visit. They have no new concerns.  States the pt will occasionally drive herself to the store which is 1 mile away.   Review of Systems See HPI  Past Medical History:  Diagnosis Date  . Atrial flutter (HCC)   . Depression   . Ejection fraction   . H/O alcohol abuse   . Low back pain   . Multiple thyroid nodules      Social History   Socioeconomic History  . Marital status: Married    Spouse name: Not on file  . Number of children: 2  . Years of education: Not on file  . Highest education level: Not on file  Occupational History  . Occupation: Designer, television/film set  . Financial resource strain: Not on file  . Food insecurity:    Worry: Not on file    Inability: Not on file  . Transportation needs:    Medical: Not on file    Non-medical: Not on file  Tobacco Use  . Smoking status: Current Every Day Smoker    Packs/day: 1.00    Last attempt to quit: 07/10/2011    Years since quitting: 6.7  . Smokeless tobacco: Never Used  . Tobacco comment: smoking since 1986  Substance and Sexual Activity  . Alcohol use: Yes    Comment: daily   . Drug use: Never  . Sexual activity: Not on file  Lifestyle  . Physical activity:    Days per week: Not on file    Minutes per session: Not on file  .  Stress: Not on file  Relationships  . Social connections:    Talks on phone: Not on file    Gets together: Not on file    Attends religious service: Not on file    Active member of club or organization: Not on file    Attends meetings of clubs or organizations: Not on file    Relationship status: Not on file  . Intimate partner violence:    Fear of current or ex partner: Not on file    Emotionally abused: Not on file    Physically abused: Not on file    Forced sexual activity: Not on file  Other Topics Concern  . Not on file  Social History Narrative   Married 13 yrs (2nd marriage)   Works at [employer]    Past Surgical History:  Procedure Laterality Date  . ABDOMINAL HYSTERECTOMY  1989   left oophorectomy  . ANTERIOR CERVICAL DISCECTOMY    . C5- C6, C6- C7 anterior cervical diskectomy and fusion with allograft and anterir plating    . scar tissue removal   2004  Family History  Problem Relation Age of Onset  . Cancer Mother        bone  . Leukemia Father   . Diabetes Brother        type 2  . Coronary artery disease Neg Hx     No Known Allergies  Current Outpatient Medications on File Prior to Visit  Medication Sig Dispense Refill  . B-Complex TABS Take 1 tablet by mouth daily.  0   No current facility-administered medications on file prior to visit.     BP (!) 142/88 (BP Location: Left Arm, Patient Position: Sitting, Cuff Size: Normal)   Pulse (!) 57   Temp 97.9 F (36.6 C) (Oral)   Ht 5\' 6"  (1.676 m)   Wt 135 lb 3.2 oz (61.3 kg)   SpO2 100%   BMI 21.82 kg/m       Objective:   Physical Exam  Constitutional: She appears well-developed and well-nourished.  Neck: Neck supple. No thyromegaly present.  Cardiovascular: Normal rate, regular rhythm and normal heart sounds.  No murmur heard. Pulmonary/Chest: Effort normal and breath sounds normal. No respiratory distress. She has no wheezes.  Musculoskeletal: She exhibits no edema.  Neurological: She is  alert.  Skin: Skin is warm and dry.  Psychiatric: She has a normal mood and affect. Her behavior is normal. Judgment and thought content normal.          Assessment & Plan:  Memory loss- unchanged. Continue B complex.  I have advised the patient and her family that I do not think she should be driving. Pt is advised to keep her upcoming appointment with neurology. Declines colonoscopy and mammogram and flu shot.    A total of 15  minutes were spent face-to-face with the patient during this encounter and over half of that time was spent on counseling. The patient was counseled on memory loss and health care maintenance.

## 2018-03-25 NOTE — Patient Instructions (Signed)
Please keep your upcoming appointment with the neurologist.

## 2018-04-08 ENCOUNTER — Other Ambulatory Visit (INDEPENDENT_AMBULATORY_CARE_PROVIDER_SITE_OTHER): Payer: 59

## 2018-04-08 ENCOUNTER — Ambulatory Visit (INDEPENDENT_AMBULATORY_CARE_PROVIDER_SITE_OTHER): Payer: 59 | Admitting: Neurology

## 2018-04-08 ENCOUNTER — Encounter

## 2018-04-08 ENCOUNTER — Other Ambulatory Visit: Payer: Self-pay

## 2018-04-08 ENCOUNTER — Encounter: Payer: Self-pay | Admitting: Neurology

## 2018-04-08 VITALS — BP 132/84 | HR 65 | Ht 66.0 in | Wt 134.0 lb

## 2018-04-08 DIAGNOSIS — R413 Other amnesia: Secondary | ICD-10-CM

## 2018-04-08 NOTE — Progress Notes (Signed)
NEUROLOGY CONSULTATION NOTE  Kari Hoffman MRN: [id number] DOB: 08-28-1960  Referring provider: [name], NP Primary care provider: [name], NP  Reason for consult:  Memory loss  Thank you for your kind referral of Kari Hoffman for consultation of the above symptoms. Although her history is well known to you, please allow me to reiterate it for the purpose of our medical record. The patient was accompanied to the clinic by her husband and daughter who also provide collateral information. Records and images were personally reviewed where available.  HISTORY OF PRESENT ILLNESS: This is a 57 year old right-handed woman with a history of atrial flutter, alcohol abuse, presenting for evaluation of worsening memory. When asked about her memory, she states "I think it comes and goes." her husband feels memory changes started after an incident last April 2019 when he came home from work and found her leaning up against the tub, unable to get up. He feels she was "pretty Hoffman normal before that." She does not recall being in the hospital for a week. Record from April 2019 hospitalization were reviewed, per notes, her husband started noticing loss of balance in February/March 2019. A few days prior to admission, he found her sitting on the floor of the living room and suspected she may have fallen. On 08/04/17, he found her leaning against the tub incontinent of stool and urine. He reported she had been sleeping and out of it for 1-2 weeks prior. She was found to have a UTI but concern for Wernicke's encephalopathy was also raised and she was treated with thiamine and folate. She has not been back to baseline since then, she would forget what they ate the prior meal, repeats herself, and every morning asking if she goes to work that day or if she still works. She moved to [facility] in 1996 but would ask why they moved to [facility] and what they are doing here. Her husband would see charges on their  credit card that she does not remember, she would not recall going to the [employer] the day prior. She is independent with dressing and bathing. Her husband administers B complex daily, she is not on any prescription medications. She does bills with her husband, who states she does well with this. She is home most of the time and denies getting lost the rare times she drives close by. No paranoia or hallucinations, her daughter thinks she is less talkative but she states she is really not a talkative person. 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 She had been working there for a year until February 2018. Family did not think she was depressed afterwards.    She denies any headaches, dizziness, diplopia, dysarthria/dysphagia, neck/back pain, focal numbness/tingling/weakness, bowel/bladder dysfunction, no recent falls. Sleep is okay, no daytime drowsiness. Her maternal grandparents had dementia. No history of significant head injuries. She has a 20+ year history of heavy alcohol use, per records she was drinking 1/2 to 1 case of beer daily in July 2019 ER visit (EtOH level 69), family reports she drank 6-8 beers a night until April admission, and since then she drinks a couple of 12 oz beers nightly. Her daughter recalls she had a couple of blackouts from alcohol abuse in the 57s while living in [place].  I personally reviewed MRI brain without contrast done 02/08/18 which did not show any acute changes,  there was mild diffuse atrophy.  Laboratory Data: Lab Results  Component Value Date   WBC 5.6 01/21/2018   HGB 13.9 01/21/2018   HCT 40.8 01/21/2018   MCV 94.7 01/21/2018   PLT 198.0 01/21/2018     Chemistry      Component Value Date/Time   NA 136 01/21/2018 1331   K 3.9 01/21/2018 1331   CL 101 01/21/2018 1331   CO2 29 01/21/2018 1331   BUN 10 01/21/2018 1331   CREATININE 0.61 01/21/2018 1331    CREATININE 0.68 12/13/2013 1525      Component Value Date/Time   CALCIUM 9.5 01/21/2018 1331   ALKPHOS 85 01/21/2018 1331   AST 20 01/21/2018 1331   ALT 14 01/21/2018 1331   BILITOT 0.7 01/21/2018 1331     Lab Results  Component Value Date   TSH 1.36 01/21/2018   Lab Results  Component Value Date   VITAMINB12 247 01/21/2018   Ammonia level 08/05/17: 13  PAST MEDICAL HISTORY: Past Medical History:  Diagnosis Date  . Atrial flutter ([facility])   . Depression   . Ejection fraction   . H/O alcohol abuse   . Low back pain   . Multiple thyroid nodules     PAST SURGICAL HISTORY: Past Surgical History:  Procedure Laterality Date  . ABDOMINAL HYSTERECTOMY  1989   left oophorectomy  . ANTERIOR CERVICAL DISCECTOMY    . C5- C6, C6- C7 anterior cervical diskectomy and fusion with allograft and anterir plating    . scar tissue removal   2004    MEDICATIONS: Current Outpatient Medications on File Prior to Visit  Medication Sig Dispense Refill  . B-Complex TABS Take 1 tablet by mouth daily.  0   No current facility-administered medications on file prior to visit.     ALLERGIES: No Known Allergies  FAMILY HISTORY: Family History  Problem Relation Age of Onset  . Cancer Mother        bone  . Leukemia Father   . Diabetes Brother        type 2  . Coronary artery disease Neg Hx     SOCIAL HISTORY: Social History   Socioeconomic History  . Marital status: Married    Spouse name: Not on file  . Number of children: 2  . Years of education: Not on file  . Highest education level: Not on file  Occupational History  . Occupation: Arts development officer  . Financial resource strain: Not on file  . Food insecurity:    Worry: Not on file    Inability: Not on file  . Transportation needs:    Medical: Not on file    Non-medical: Not on file  Tobacco Use  . Smoking status: Current Every Day Smoker    Packs/day: 1.00    Last attempt to quit: 07/10/2011    Years since  quitting: 6.7  . Smokeless tobacco: Never Used  . Tobacco comment: smoking since 1986  Substance and Sexual Activity  . Alcohol use: Yes    Comment: daily   . Drug use: Never  . Sexual activity: Not on file  Lifestyle  . Physical activity:    Days per week: Not on file    Minutes per session: Not on file  . Stress: Not on file  Relationships  . Social connections:    Talks on phone: Not on file    Gets together: Not on file    Attends religious service: Not on file  Active member of club or organization: Not on file    Attends meetings of clubs or organizations: Not on file    Relationship status: Not on file  . Intimate partner violence:    Fear of current or ex partner: Not on file    Emotionally abused: Not on file    Physically abused: Not on file    Forced sexual activity: Not on file  Other Topics Concern  . Not on file  Social History Narrative   Married 13 yrs (2nd marriage)   Works at Advertising copywriter    REVIEW OF SYSTEMS: Constitutional: No fevers, chills, or sweats, no generalized fatigue, change in appetite Eyes: No visual changes, double vision, eye pain Ear, nose and throat: No hearing loss, ear pain, nasal congestion, sore throat Cardiovascular: No chest pain, palpitations Respiratory:  No shortness of breath at rest or with exertion, wheezes GastrointestinaI: No nausea, vomiting, diarrhea, abdominal pain, fecal incontinence Genitourinary:  No dysuria, urinary retention or frequency Musculoskeletal:  No neck pain, back pain Integumentary: No rash, pruritus, skin lesions Neurological: as above Psychiatric: No depression, insomnia, anxiety Endocrine: No palpitations, fatigue, diaphoresis, mood swings, change in appetite, change in weight, increased thirst Hematologic/Lymphatic:  No anemia, purpura, petechiae. Allergic/Immunologic: no itchy/runny eyes, nasal congestion, recent allergic reactions, rashes  PHYSICAL EXAM: Vitals:   04/08/18 1047  BP: 132/84    Pulse: 65  SpO2: 99%   General: No acute distress, appears older than stated age, disheveled appearance, flat affect Head:  Normocephalic/atraumatic Eyes: Fundoscopic exam shows bilateral sharp discs, no vessel changes, exudates, or hemorrhages Neck: supple, no paraspinal tenderness, full range of motion Back: No paraspinal tenderness Heart: regular rate and rhythm Lungs: Clear to auscultation bilaterally. Vascular: No carotid bruits. Skin/Extremities: No rash, no edema Neurological Exam: Mental status: alert and oriented to person, city, date (did not know year), no dysarthria or aphasia, Fund of knowledge is reduced. Recent and remote memory are impaired.  Attention and concentration are reduced.    Able to name objects, difficulty with repetition. Montreal Cognitive Assessment  04/08/2018  Visuospatial/ Executive (0/5) 3  Naming (0/3) 2  Attention: Read list of digits (0/2) 2  Attention: Read list of letters (0/1) 1  Attention: Serial 7 subtraction starting at 100 (0/3) 0  Language: Repeat phrase (0/2) 0  Language : Fluency (0/1) 0  Abstraction (0/2) 1  Delayed Recall (0/5) 0  Orientation (0/6) 4  Total 13  Adjusted Score (based on education) 14   Cranial nerves: CN I: not tested CN II: pupils equal, round and reactive to light, visual fields intact, fundi unremarkable. CN III, IV, VI:  full range of motion, no nystagmus, no ptosis CN V: facial sensation intact CN VII: upper and lower face symmetric CN VIII: hearing intact to finger rub CN IX, X: gag intact, uvula midline CN XI: sternocleidomastoid and trapezius muscles intact CN XII: tongue midline Bulk & Tone: normal, no fasciculations. Motor: 5/5 throughout with no pronator drift. Sensation: intact to light touch, cold, pin on both UE, decreased pin to right ankle, decreased cold to ankles bilaterally, decreased vibration sense to knees bilaterally. No extinction to double simultaneous stimulation.  Romberg test mild  sway Deep Tendon Reflexes: +2 throughout except for absent ankle jerks bilaterally, no ankle clonus Plantar responses: downgoing bilaterally Cerebellar: no incoordination on finger to nose, heel to shin. No dysdiadochokinesia Gait: narrow-based and steady, mild difficulty with tandem walk Tremor: none  IMPRESSION: This is a 57 year old right-handed woman with  a history of atrial flutter, alcohol abuse, presenting for evaluation of memory loss. She was admitted in April 2019 for altered mental status in setting of UTI, however concern for Wernicke encephalopathy was raised. 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 We discussed side effects and expectations from Donepezil, start '5mg'$  daily for 2 weeks, then increase to '10mg'$  daily. Continue daily folic acid and thiamine. We discussed alcohol cessation. If testing is unremarkable, proceed with Neurocognitive testing to further evaluate cognitive complaints. We discussed no further driving. Follow-up after tests, they know to call for any changes.   Thank you for allowing me to participate in the care of this patient. Please do not hesitate to call for any questions or concerns.   Ellouise Newer, M.D.  CC: [name], NP

## 2018-04-08 NOTE — Patient Instructions (Addendum)
1. Bloodwork for RPR, ESR, CRP, ANA, anti-thyroglobulin antibodies, anti-thyroid peroxidase antibodies  Your provider requests that you have LABS drawn today.  We share a lab with [facility] - they are located in [address] (second floor) of this building.  Once you get there, please have a seat and the phlebotomist will call your name.  If you have waited more than 15 minutes, please advise the front desk   2. Schedule routine EEG  3. Schedule lumbar puncture under fluoroscopy  We have sent a referral to [facility] for your LUMBAR PUNCTURE and they will call you directly to schedule your appt. They are located at [place]. If you need to contact them directly please call [phone].   4. Start Donepezil '10mg'$ : take 1/2 tablet daily for 2 weeks, then increase to 1 tablet daily  5. Recommend stopping alcohol intake  6. No further driving  7. If tests come back fine, we will plan for further memory testing (Neurocognitive testing) at [facility]  8. Follow-up after tests  FALL PRECAUTIONS: Be cautious when walking. Scan the area for obstacles that may increase the risk of trips and falls. When getting up in the mornings, sit up at the edge of the bed for a few minutes before getting out of bed. Consider elevating the bed at the head end to avoid drop of blood pressure when getting up. Walk always in a well-lit room (use night lights in the walls). Avoid area rugs or power cords from appliances in the middle of the walkways. Use a walker or a cane if necessary and consider physical therapy for balance exercise. Get your eyesight checked regularly.  FINANCIAL OVERSIGHT: Supervision, especially oversight when making financial decisions or transactions is also recommended.  HOME SAFETY: Consider the safety of the kitchen when operating appliances like stoves, microwave oven, and blender. Consider having supervision and share cooking responsibilities until no longer  able to participate in those. Accidents with firearms and other hazards in the house should be identified and addressed as well.  DRIVING: Regarding driving, in patients with progressive memory problems, driving will be impaired. We advise to have someone else do the driving if trouble finding directions or if minor accidents are reported. Independent driving assessment is available to determine safety of driving.  ABILITY TO BE LEFT ALONE: If patient is unable to contact 911 operator, consider using LifeLine, or when the need is there, arrange for someone to stay with patients. Smoking is a fire hazard, consider supervision or cessation. Risk of wandering should be assessed by caregiver and if detected at any point, supervision and safe proof recommendations should be instituted.  MEDICATION SUPERVISION: Inability to self-administer medication needs to be constantly addressed. Implement a mechanism to ensure safe administration of the medications.  RECOMMENDATIONS FOR ALL PATIENTS WITH MEMORY PROBLEMS: 1. Continue to exercise (Recommend 30 minutes of walking everyday, or 3 hours every week) 2. Increase social interactions - continue going to [place] and enjoy social gatherings with friends and family 3. Eat healthy, avoid fried foods and eat more fruits and vegetables 4. Maintain adequate blood pressure, blood sugar, and blood cholesterol level. Reducing the risk of stroke and cardiovascular disease also helps promoting better memory. 5. Avoid stressful situations. Live a simple life and avoid aggravations. Organize your time and prepare for the next day in anticipation. 6. Sleep well, avoid any interruptions of sleep and avoid any distractions in the bedroom that may interfere with adequate sleep quality 7. Avoid sugar,  avoid sweets as there is a strong link between excessive sugar intake, diabetes, and cognitive impairment The Mediterranean diet has been shown to help patients reduce the risk of  progressive memory disorders and reduces cardiovascular risk. This includes eating fish, eat fruits and green leafy vegetables, nuts like almonds and hazelnuts, walnuts, and also use olive oil. Avoid fast foods and fried foods as much as possible. Avoid sweets and sugar as sugar use has been linked to worsening of memory function.  There is always a concern of gradual progression of memory problems. If this is the case, then we may need to adjust level of care according to patient needs. Support, both to the patient and caregiver, should then be put into place.

## 2018-04-10 ENCOUNTER — Telehealth: Payer: Self-pay | Admitting: Neurology

## 2018-04-10 LAB — ANTI-NUCLEAR AB-TITER (ANA TITER)

## 2018-04-10 LAB — RPR: RPR Ser Ql: NONREACTIVE

## 2018-04-10 LAB — SEDIMENTATION RATE: Sed Rate: 14 mm/h (ref 0–30)

## 2018-04-10 LAB — ANA: Anti Nuclear Antibody(ANA): POSITIVE — AB

## 2018-04-10 LAB — C-REACTIVE PROTEIN: CRP: 6.8 mg/L (ref <8.0)

## 2018-04-10 LAB — THYROGLOBULIN LEVEL: Thyroglobulin: 8.4 ng/mL

## 2018-04-10 LAB — THYROID PEROXIDASE ANTIBODY: THYROID PEROXIDASE ANTIBODY: 1 [IU]/mL (ref <9)

## 2018-04-10 MED ORDER — DONEPEZIL HCL 10 MG PO TABS: ORAL_TABLET | ORAL | 3 refills | Status: DC

## 2018-04-10 NOTE — Telephone Encounter (Signed)
Patient's husband is calling in about if the medication is ready at the pharm. He didn't know the name of the medication. Thanks!

## 2018-04-10 NOTE — Telephone Encounter (Signed)
LMOM for pt's husband advising that Rx has been sent and will be available for pick up later today.

## 2018-04-27 ENCOUNTER — Other Ambulatory Visit: Payer: 59

## 2018-04-29 ENCOUNTER — Ambulatory Visit (INDEPENDENT_AMBULATORY_CARE_PROVIDER_SITE_OTHER): Payer: 59 | Admitting: Neurology

## 2018-04-29 DIAGNOSIS — R413 Other amnesia: Secondary | ICD-10-CM | POA: Diagnosis not present

## 2018-04-30 NOTE — Procedures (Signed)
ELECTROENCEPHALOGRAM REPORT  Date of Study: 04/29/2018  Patient's Name: Philis KendallDonna R Scholler MRN: [id number] Date of Birth: Apr 10, 1961  Referring Provider: Dr. Patrcia DollyKaren Gian Ybarra  Clinical History: This is a 58 year old woman with altered mental status.  Medications: B-Complex TABS  Technical Summary: A multichannel digital EEG recording measured by the international 10-20 system with electrodes applied with paste and impedances below 5000 ohms performed in our laboratory with EKG monitoring in an awake and drowsy patient.  Hyperventilation was not performed. Photic stimulation was performed.  The digital EEG was referentially recorded, reformatted, and digitally filtered in a variety of bipolar and referential montages for optimal display.    Description: The patient is awake and drowsy during the recording.  During maximal wakefulness, there is a symmetric, medium voltage 8-9 Hz posterior dominant rhythm that attenuates with eye opening.  The record is symmetric.  During drowsiness, there is an increase in theta slowing of the background. Deeper stages of sleep were not seen. Photic stimulation did not elicit any abnormalities.  There were no epileptiform discharges or electrographic seizures seen.    EKG lead was unremarkable.  Impression: This awake and drowsy EEG is normal.    Clinical Correlation: A normal EEG does not exclude a clinical diagnosis of epilepsy.  If further clinical questions remain, prolonged EEG may be helpful.  Clinical correlation is advised.   Patrcia DollyKaren Sahory Nordling, M.D.

## 2018-05-11 ENCOUNTER — Telehealth: Payer: Self-pay

## 2018-05-11 DIAGNOSIS — R413 Other amnesia: Secondary | ICD-10-CM

## 2018-05-11 NOTE — Telephone Encounter (Signed)
-----   Message from Van Clines, MD sent at 05/04/2018 11:28 AM EST ----- Pls let husband/daughter know the EEG was normal, proceed with lumbar puncture as discussed. Thanks

## 2018-05-11 NOTE — Telephone Encounter (Signed)
Called pt's husband, Kari Hoffman.  No answer.  LMOM relaying results below.  Inquired if they have been contacted by [facility] or [facility] in regards to LP.  Asked for return call.

## 2018-05-12 NOTE — Telephone Encounter (Signed)
**  LATE DOCUMENTATIONDorothyann Hoffman returned my call just as I had clocked out for the day 05/11/2018.  Kari Hoffman stated that he received kit from [facility], but has not heard from [facility] about LP.  Advised that I would send order again to see if maybe there was some confusion about previous order (this was a tricky order - between [place] and [place])

## 2018-05-12 NOTE — Addendum Note (Signed)
Addended by: Horatio Pel on: 05/12/2018 09:47 AM   Modules accepted: Orders

## 2018-05-20 ENCOUNTER — Ambulatory Visit
Admission: RE | Admit: 2018-05-20 | Discharge: 2018-05-20 | Disposition: A | Discharge status: Home / Self Care | Payer: 59 | Source: Ambulatory Visit | Attending: Neurology | Admitting: Neurology

## 2018-05-20 ENCOUNTER — Other Ambulatory Visit (HOSPITAL_COMMUNITY)
Admission: RE | Admit: 2018-05-20 | Discharge: 2018-05-20 | Disposition: A | Discharge status: Home / Self Care | Payer: 59 | Source: Ambulatory Visit | Attending: Neurology | Admitting: Neurology

## 2018-05-20 VITALS — BP 125/93 | HR 56

## 2018-05-20 DIAGNOSIS — R413 Other amnesia: Secondary | ICD-10-CM | POA: Insufficient documentation

## 2018-05-20 DIAGNOSIS — R4182 Altered mental status, unspecified: Secondary | ICD-10-CM

## 2018-05-20 DIAGNOSIS — F1011 Alcohol abuse, in remission: Secondary | ICD-10-CM

## 2018-05-20 DIAGNOSIS — G9340 Encephalopathy, unspecified: Secondary | ICD-10-CM

## 2018-05-20 NOTE — Progress Notes (Addendum)
Two SST tubes of blood drawn from left AC space for LP labs; site unremarkable.

## 2018-05-20 NOTE — Discharge Instructions (Signed)

## 2018-06-11 ENCOUNTER — Telehealth: Payer: Self-pay

## 2018-06-11 DIAGNOSIS — R4182 Altered mental status, unspecified: Secondary | ICD-10-CM

## 2018-06-11 DIAGNOSIS — R413 Other amnesia: Secondary | ICD-10-CM

## 2018-06-11 NOTE — Telephone Encounter (Signed)
LMOM for pt's' husband, Duffy Rhody, relaying message below.  Asked for return call about neurocognitive testing.    Dr. McDermott's office is no longer accepting pt's that are not referred by [facility] physicians.  Did not leave Dr. McDermott's name on message, however did relay that there are offices in [place], [place], [place] and [place] as well as [place].

## 2018-06-11 NOTE — Telephone Encounter (Signed)
-----   Message from Van Clines, MD sent at 06/08/2018 12:54 PM EST ----- Pls let husband/daughter know that so far all the tests from the spinal tap have come back normal. No evidence of inflammation or infection. If they would like to do further memory testing, we can either send to Dr. Jacquelyne Balint or [facility]. Thanks

## 2018-06-16 NOTE — Addendum Note (Signed)
Addended by: Horatio Pel on: 06/16/2018 04:34 PM   Modules accepted: Orders

## 2018-06-16 NOTE — Telephone Encounter (Signed)
Spoke with pt's husband relaying message below.  He states that he would prefer to have testing done in [facility].  Orders placed and faxed.

## 2018-06-29 ENCOUNTER — Telehealth: Payer: Self-pay | Admitting: Neurology

## 2018-06-29 NOTE — Telephone Encounter (Signed)
Husband is calling in about his wife for [facility]. They aren't in-network and he had some questions. Please call him back at [phone]. Thanks!

## 2018-06-30 ENCOUNTER — Telehealth: Payer: Self-pay | Admitting: Neurology

## 2018-06-30 LAB — CSF CULTURE W GRAM STAIN
GRAM STAIN:: NONE SEEN
MICRO NUMBER:: 121346
Result:: NO GROWTH
SPECIMEN QUALITY:: ADEQUATE

## 2018-06-30 LAB — FUNGUS CULTURE W SMEAR
MICRO NUMBER:: 121345
SMEAR:: NONE SEEN
SPECIMEN QUALITY: ADEQUATE

## 2018-06-30 LAB — PROTEIN, CSF: Total Protein, CSF: 26 mg/dL (ref 15–45)

## 2018-06-30 LAB — ATHENA TSTG MISC
PRICE:: 1420
PRICE:: 3705

## 2018-06-30 LAB — CNS IGG SYNTHESIS RATE, CSF+BLOOD
ALBUMIN,CSF: 13.5 mg/dL (ref 8.0–42.0)
Albumin Serum: 4.3 g/dL (ref 3.5–5.2)
CNS-IgG Synthesis Rate: -4 mg/24 h (ref <=3.3)
IGG-INDEX: 0.48 (ref <0.66)
IgG (Immunoglobin G), Serum: 1190 mg/dL (ref 600–1640)
IgG Total CSF: 1.8 mg/dL (ref 0.8–7.7)

## 2018-06-30 LAB — PROTEIN, CSF 14-3-3 (PRION DISEASE)
14-3-3 PROTEIN (CSF)++: NEGATIVE
EST PROB PRION DIS IN PATIENT: <2 %
RT-QUIC (CSF)*: NEGATIVE
T-TAU PROTEIN (CSF)++: 382 pg/ml (ref 0–1149)

## 2018-06-30 LAB — CSF CELL COUNT WITH DIFFERENTIAL
RBC Count, CSF: 0 cells/uL (ref 0–10)
WBC CSF: 1 {cells}/uL (ref 0–5)

## 2018-06-30 LAB — CRYPTOCOCCAL AG, LTX SCR RFLX TITER
Cryptococcal Ag Screen: NOT DETECTED
MICRO NUMBER:: 121344
SPECIMEN QUALITY:: ADEQUATE

## 2018-06-30 LAB — HSV DNA BY PCR (REFERENCE LAB)
HSV 1 DNA: NOT DETECTED
HSV 2 DNA: NOT DETECTED

## 2018-06-30 LAB — GLUCOSE, CSF: GLUCOSE CSF: 57 mg/dL (ref 40–80)

## 2018-06-30 LAB — OLIGOCLONAL BANDS, CSF + SERM

## 2018-06-30 NOTE — Telephone Encounter (Signed)
Patient's husband called regarding nerve testing at [facility]? He was wondering would they be in Network? He said that [facility] was out of Network. Please Call. Thanks

## 2018-06-30 NOTE — Telephone Encounter (Signed)
Returned call to pt's husband.  No answer.  LMOM asking for return call.  

## 2018-07-01 NOTE — Telephone Encounter (Signed)
I spoke with patient's husband and instructed him to contact his insurance company or I can send the referral and they will let us know if they are not in network.  He requested for me to send the referral.

## 2018-08-07 ENCOUNTER — Other Ambulatory Visit: Payer: Self-pay | Admitting: Neurology

## 2018-09-22 ENCOUNTER — Ambulatory Visit: Payer: 59 | Admitting: Family

## 2018-09-23 ENCOUNTER — Ambulatory Visit: Payer: 59 | Admitting: Family

## 2018-11-18 ENCOUNTER — Ambulatory Visit (INDEPENDENT_AMBULATORY_CARE_PROVIDER_SITE_OTHER): Payer: 59 | Admitting: Family

## 2018-11-18 ENCOUNTER — Encounter: Payer: Self-pay | Admitting: Family

## 2018-11-18 ENCOUNTER — Other Ambulatory Visit: Payer: Self-pay

## 2018-11-18 VITALS — BP 133/65 | HR 76 | Temp 97.7°F | Resp 16 | Ht 66.0 in | Wt 134.2 lb

## 2018-11-18 DIAGNOSIS — Z720 Tobacco use: Secondary | ICD-10-CM | POA: Diagnosis not present

## 2018-11-18 DIAGNOSIS — R413 Other amnesia: Secondary | ICD-10-CM

## 2018-11-18 DIAGNOSIS — Z23 Encounter for immunization: Secondary | ICD-10-CM

## 2018-11-18 NOTE — Progress Notes (Signed)
Subjective:    Patient ID: Kari Hoffman, female    DOB: [birth year], 58 y.o.   MRN: [id number]  HPI  Patient is a 58 yr old female who presents today for follow up.  Memory loss-patient met with neurology back in December.  She was started on trial of benazepril, folic acid, and thiamine.  Alcohol cessation was discussed.  She did undergo a lumbar puncture and testing was within normal limits.  No evidence of inflammation or infection.  Referral was made for neuro cognitive testing. Patient is doing well per husband. This visit has not occurred due to covid but he plans to work on scheduling.   Patient smokes some (on occasion)  Declines mammogram. She is agreeable to tdap today.   Review of Systems See HPI  Past Medical History:  Diagnosis Date  . Atrial flutter ([place])   . Depression   . Ejection fraction   . H/O alcohol abuse   . Low back pain   . Multiple thyroid nodules      Social History   Socioeconomic History  . Marital status: Married    Spouse name: Not on file  . Number of children: 2  . Years of education: Not on file  . Highest education level: Not on file  Occupational History  . Occupation: Arts development officer  . Financial resource strain: Not on file  . Food insecurity    Worry: Not on file    Inability: Not on file  . Transportation needs    Medical: Not on file    Non-medical: Not on file  Tobacco Use  . Smoking status: Current Every Day Smoker    Packs/day: 1.00    Last attempt to quit: 07/10/2011    Years since quitting: 7.3  . Smokeless tobacco: Never Used  . Tobacco comment: smoking since 1986  Substance and Sexual Activity  . Alcohol use: Yes    Comment: daily   . Drug use: Never  . Sexual activity: Not on file  Lifestyle  . Physical activity    Days per week: Not on file    Minutes per session: Not on file  . Stress: Not on file  Relationships  . Social Herbalist on phone: Not on file    Gets together: Not on  file    Attends religious service: Not on file    Active member of club or organization: Not on file    Attends meetings of clubs or organizations: Not on file    Relationship status: Not on file  . Intimate partner violence    Fear of current or ex partner: Not on file    Emotionally abused: Not on file    Physically abused: Not on file    Forced sexual activity: Not on file  Other Topics Concern  . Not on file  Social History Narrative   Pt lives in 2 story home with her husband, daughter and son-in-law   Has 2 adult children   GED - does not remember how far in school prior to GED   Worked in Librarian, academic for a few grocery stores and [employer]    Past Surgical History:  Procedure Laterality Date  . ABDOMINAL HYSTERECTOMY  1989   left oophorectomy  . ANTERIOR CERVICAL DISCECTOMY    . C5- C6, C6- C7 anterior cervical diskectomy and fusion with allograft and anterir plating    . scar tissue removal   2004  Family History  Problem Relation Age of Onset  . Cancer Mother        bone  . Leukemia Father   . Diabetes Brother        type 2  . Coronary artery disease Neg Hx     No Known Allergies  Current Outpatient Medications on File Prior to Visit  Medication Sig Dispense Refill  . donepezil (ARICEPT) 10 MG tablet TAKE 1/2 TAB BEDTIME FOR 2 WEEKS THEN 1 TAB AT BEDTIME 90 tablet 1  . Multiple Vitamin (MULTIVITAMIN) capsule Take 1 capsule by mouth daily.     No current facility-administered medications on file prior to visit.     BP 133/65 (BP Location: Right Arm, Patient Position: Sitting, Cuff Size: Small)   Pulse 76   Temp 97.7 F (36.5 C) (Oral)   Resp 16   Ht '5\' 6"'$  (1.676 m)   Wt 134 lb 3.2 oz (60.9 kg)   SpO2 100%   BMI 21.66 kg/m       Objective:   Physical Exam Constitutional:      Appearance: She is well-developed.  Neck:     Musculoskeletal: Neck supple.     Thyroid: No thyromegaly.  Cardiovascular:     Rate and Rhythm: Normal rate and  regular rhythm.     Heart sounds: Normal heart sounds. No murmur.  Pulmonary:     Effort: Pulmonary effort is normal. No respiratory distress.     Breath sounds: Normal breath sounds. No wheezing.  Skin:    General: Skin is warm and dry.  Neurological:     Mental Status: She is alert.     Comments: Oriented to place.  Unable to name the year  Psychiatric:        Behavior: Behavior normal.        Thought Content: Thought content normal.        Judgment: Judgment normal.           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 Following with neurology. Await neurocognitive evaluation.  Tobacco abuse- discussed cessation.

## 2019-02-12 ENCOUNTER — Encounter: Payer: Self-pay | Admitting: Family

## 2019-02-12 ENCOUNTER — Other Ambulatory Visit: Payer: Self-pay

## 2019-02-12 ENCOUNTER — Ambulatory Visit (INDEPENDENT_AMBULATORY_CARE_PROVIDER_SITE_OTHER): Payer: 59 | Admitting: Family

## 2019-02-12 VITALS — BP 123/70 | HR 52 | Temp 97.0°F | Resp 16 | Ht 66.0 in | Wt 139.0 lb

## 2019-02-12 DIAGNOSIS — B029 Zoster without complications: Secondary | ICD-10-CM | POA: Diagnosis not present

## 2019-02-12 DIAGNOSIS — Z0001 Encounter for general adult medical examination with abnormal findings: Secondary | ICD-10-CM

## 2019-02-12 DIAGNOSIS — Z0000 Encounter for general adult medical examination without abnormal findings: Secondary | ICD-10-CM

## 2019-02-12 MED ORDER — VALACYCLOVIR HCL 1 G PO TABS: 1000.0000 mg | ORAL_TABLET | Freq: Three times a day (TID) | ORAL | 0 refills | Status: DC

## 2019-02-12 NOTE — Progress Notes (Signed)
Subjective:    Patient ID: Kari Hoffman, female    DOB: 04-Nov-1960, 58 y.o.   MRN: [id number]  HPI  Patient presents today for complete physical. Her husband accompanies her to today's appointment.   Immunizations: Td Diet: reports healthy diet Exercise:  Walks daily Colonoscopy:   Declines col, declines cologuard Dexa:  declines Pap Smear:  hysterectomy Mammogram: due, declines Vision:  Unsure of last eye exam Dental:  Has full dentures  Skin rash-reports that rash started about 2 days ago on her right upper back. Notes associated pruritis. Denies significant pain.      Review of Systems  Constitutional: Negative for unexpected weight change.  HENT: Negative for hearing loss and rhinorrhea.   Eyes: Negative for visual disturbance.  Respiratory: Negative for cough and shortness of breath.   Cardiovascular: Negative for chest pain.  Gastrointestinal: Negative for constipation and diarrhea.  Genitourinary: Negative for dysuria, frequency and hematuria.  Musculoskeletal: Negative for arthralgias and myalgias.  Skin: Positive for rash.  Neurological: Negative for headaches.  Hematological: Negative for adenopathy.  Psychiatric/Behavioral:       Denies depression/anxiety   Past Medical History:  Diagnosis Date  . Atrial flutter ([place])   . Depression   . Ejection fraction   . H/O alcohol abuse   . Low back pain   . Multiple thyroid nodules      Social History   Socioeconomic History  . Marital status: Married    Spouse name: Not on file  . Number of children: 2  . Years of education: Not on file  . Highest education level: Not on file  Occupational History  . Occupation: Arts development officer  . Financial resource strain: Not on file  . Food insecurity    Worry: Not on file    Inability: Not on file  . Transportation needs    Medical: Not on file    Non-medical: Not on file  Tobacco Use  . Smoking status: Current Every Day Smoker    Packs/day: 1.00   Last attempt to quit: 07/10/2011    Years since quitting: 7.6  . Smokeless tobacco: Never Used  . Tobacco comment: smoking since 1986  Substance and Sexual Activity  . Alcohol use: Yes    Comment: daily   . Drug use: Never  . Sexual activity: Not on file  Lifestyle  . Physical activity    Days per week: Not on file    Minutes per session: Not on file  . Stress: Not on file  Relationships  . Social Herbalist on phone: Not on file    Gets together: Not on file    Attends religious service: Not on file    Active member of club or organization: Not on file    Attends meetings of clubs or organizations: Not on file    Relationship status: Not on file  . Intimate partner violence    Fear of current or ex partner: Not on file    Emotionally abused: Not on file    Physically abused: Not on file    Forced sexual activity: Not on file  Other Topics Concern  . Not on file  Social History Narrative   Pt lives in 2 story home with her husband, daughter and son-in-law   Has 2 adult children   GED - does not remember how far in school prior to GED   Worked in Librarian, academic for a few grocery stores and  [employer]    Past Surgical History:  Procedure Laterality Date  . ABDOMINAL HYSTERECTOMY  1989   left oophorectomy  . ANTERIOR CERVICAL DISCECTOMY    . C5- C6, C6- C7 anterior cervical diskectomy and fusion with allograft and anterir plating    . scar tissue removal   2004    Family History  Problem Relation Age of Onset  . Cancer Mother        bone  . Leukemia Father   . Diabetes Brother        type 2  . Coronary artery disease Neg Hx     No Known Allergies  Current Outpatient Medications on File Prior to Visit  Medication Sig Dispense Refill  . donepezil (ARICEPT) 10 MG tablet TAKE 1/2 TAB BEDTIME FOR 2 WEEKS THEN 1 TAB AT BEDTIME 90 tablet 1  . Multiple Vitamin (MULTIVITAMIN) capsule Take 1 capsule by mouth daily.     No current facility-administered  medications on file prior to visit.     BP 123/70 (BP Location: Right Arm, Patient Position: Sitting, Cuff Size: Small)   Pulse (!) 52   Temp (!) 97 F (36.1 C) (Temporal)   Resp 16   Ht 5\' 6"  (1.676 m)   Wt 139 lb (63 kg)   SpO2 100%   BMI 22.44 kg/m       Objective:   Physical Exam  Physical Exam  Constitutional: She is oriented to person, place, and time. She appears well-developed and well-nourished. No distress.  HENT:  Head: Normocephalic and atraumatic.  Right Ear: Tympanic membrane and ear canal normal.  Left Ear: Tympanic membrane and ear canal normal.  Mouth/Throat: Not examined- pt wearing mask for covid-19 precautions Eyes: Pupils are equal, round, and reactive to light. No scleral icterus.  Neck: Normal range of motion. No thyromegaly present.  Cardiovascular: Normal rate and regular rhythm.  No murmur heard. Pulmonary/Chest: Effort normal and breath sounds normal. No respiratory distress. He has no wheezes. She has no rales. She exhibits no tenderness.  Abdominal: Soft. Bowel sounds are normal. She exhibits no distension and no mass. There is no tenderness. There is no rebound and no guarding.  Musculoskeletal: She exhibits no edema.  Lymphadenopathy:    She has no cervical adenopathy.  Neurological: She is alert but cognition/memory seem somewhat impaired. She has normal patellar reflexes. She exhibits normal muscle tone. Coordination normal.  Skin: Skin is warm and dry. + blistered rash in a linear pattern right upper back across shoulder blade Psychiatric: She has a flat affect affect. Her behavior is normal. Judgment and thought content normal.  Breasts: declined Pelvic: deferred      Assessment & Plan:   Preventative care- declines colo/cologuard, declines mammo and dexa. Declines flu shot.  Recommended that she consider shingrix after acute shingles outbreak resolves.   Herpes zoster- new. Will rx with valtrex. She is advised to call if symptoms worsen  or fail to improve.     Assessment & Plan:

## 2019-02-12 NOTE — Patient Instructions (Addendum)
Please complete lab work prior to leaving.    Preventive Care 24-58 Years Old, Female Preventive care refers to visits with your health care provider and lifestyle choices that can promote health and wellness. This includes:  A yearly physical exam. This may also be called an annual well check.  Regular dental visits and eye exams.  Immunizations.  Screening for certain conditions.  Healthy lifestyle choices, such as eating a healthy diet, getting regular exercise, not using drugs or products that contain nicotine and tobacco, and limiting alcohol use. What can I expect for my preventive care visit? Physical exam Your health care provider will check your:  Height and weight. This may be used to calculate body mass index (BMI), which tells if you are at a healthy weight.  Heart rate and blood pressure.  Skin for abnormal spots. Counseling Your health care provider may ask you questions about your:  Alcohol, tobacco, and drug use.  Emotional well-being.  Home and relationship well-being.  Sexual activity.  Eating habits.  Work and work Statistician.  Method of birth control.  Menstrual cycle.  Pregnancy history. What immunizations do I need?  Influenza (flu) vaccine  This is recommended every year. Tetanus, diphtheria, and pertussis (Tdap) vaccine  You may need a Td booster every 10 years. Varicella (chickenpox) vaccine  You may need this if you have not been vaccinated. Zoster (shingles) vaccine  You may need this after age 32. Measles, mumps, and rubella (MMR) vaccine  You may need at least one dose of MMR if you were born in 1957 or later. You may also need a second dose. Pneumococcal conjugate (PCV13) vaccine  You may need this if you have certain conditions and were not previously vaccinated. Pneumococcal polysaccharide (PPSV23) vaccine  You may need one or two doses if you smoke cigarettes or if you have certain conditions. Meningococcal conjugate  (MenACWY) vaccine  You may need this if you have certain conditions. Hepatitis A vaccine  You may need this if you have certain conditions or if you travel or work in places where you may be exposed to hepatitis A. Hepatitis B vaccine  You may need this if you have certain conditions or if you travel or work in places where you may be exposed to hepatitis B. Haemophilus influenzae type b (Hib) vaccine  You may need this if you have certain conditions. Human papillomavirus (HPV) vaccine  If recommended by your health care provider, you may need three doses over 6 months. You may receive vaccines as individual doses or as more than one vaccine together in one shot (combination vaccines). Talk with your health care provider about the risks and benefits of combination vaccines. What tests do I need? Blood tests  Lipid and cholesterol levels. These may be checked every 5 years, or more frequently if you are over 72 years old.  Hepatitis C test.  Hepatitis B test. Screening  Lung cancer screening. You may have this screening every year starting at age 24 if you have a 30-pack-year history of smoking and currently smoke or have quit within the past 15 years.  Colorectal cancer screening. All adults should have this screening starting at age 44 and continuing until age 17. Your health care provider may recommend screening at age 66 if you are at increased risk. You will have tests every 1-10 years, depending on your results and the type of screening test.  Diabetes screening. This is done by checking your blood sugar (glucose) after you have  not eaten for a while (fasting). You may have this done every 1-3 years.  Mammogram. This may be done every 1-2 years. Talk with your health care provider about when you should start having regular mammograms. This may depend on whether you have a family history of breast cancer.  BRCA-related cancer screening. This may be done if you have a family  history of breast, ovarian, tubal, or peritoneal cancers.  Pelvic exam and Pap test. This may be done every 3 years starting at age 57. Starting at age 77, this may be done every 5 years if you have a Pap test in combination with an HPV test. Other tests  Sexually transmitted disease (STD) testing.  Bone density scan. This is done to screen for osteoporosis. You may have this scan if you are at high risk for osteoporosis. Follow these instructions at home: Eating and drinking  Eat a diet that includes fresh fruits and vegetables, whole grains, lean protein, and low-fat dairy.  Take vitamin and mineral supplements as recommended by your health care provider.  Do not drink alcohol if: ? Your health care provider tells you not to drink. ? You are pregnant, may be pregnant, or are planning to become pregnant.  If you drink alcohol: ? Limit how much you have to 0-1 drink a day. ? Be aware of how much alcohol is in your drink. In the [place] one drink equals one 12 oz bottle of beer (355 mL), one 5 oz glass of wine (148 mL), or one 1 oz glass of hard liquor (44 mL). Lifestyle  Take daily care of your teeth and gums.  Stay active. Exercise for at least 30 minutes on 5 or more days each week.  Do not use any products that contain nicotine or tobacco, such as cigarettes, e-cigarettes, and chewing tobacco. If you need help quitting, ask your health care provider.  If you are sexually active, practice safe sex. Use a condom or other form of birth control (contraception) in order to prevent pregnancy and STIs (sexually transmitted infections).  If told by your health care provider, take low-dose aspirin daily starting at age 28. What's next?  Visit your health care provider once a year for a well check visit.  Ask your health care provider how often you should have your eyes and teeth checked.  Stay up to date on all vaccines. This information is not intended to replace advice given to you  by your health care provider. Make sure you discuss any questions you have with your health care provider. Document Released: 05/05/2015 Document Revised: 12/18/2017 Document Reviewed: 12/18/2017 Elsevier Patient Education  2020 [employer].

## 2019-02-13 LAB — HEPATIC FUNCTION PANEL
AG Ratio: 1.5 (calc) (ref 1.0–2.5)
ALT: 12 U/L (ref 6–29)
AST: 20 U/L (ref 10–35)
Albumin: 4.4 g/dL (ref 3.6–5.1)
Alkaline phosphatase (APISO): 87 U/L (ref 37–153)
Bilirubin, Direct: 0.1 mg/dL (ref 0.0–0.2)
Globulin: 3 g/dL (calc) (ref 1.9–3.7)
Indirect Bilirubin: 0.2 mg/dL (calc) (ref 0.2–1.2)
Total Bilirubin: 0.3 mg/dL (ref 0.2–1.2)
Total Protein: 7.4 g/dL (ref 6.1–8.1)

## 2019-02-13 LAB — CBC WITH DIFFERENTIAL/PLATELET
Absolute Monocytes: 707 cells/uL (ref 200–950)
Basophils Absolute: 27 cells/uL (ref 0–200)
Basophils Relative: 0.4 %
Eosinophils Absolute: 184 cells/uL (ref 15–500)
Eosinophils Relative: 2.7 %
HCT: 36.4 % (ref 35.0–45.0)
Hemoglobin: 12.3 g/dL (ref 11.7–15.5)
Lymphs Abs: 2849 cells/uL (ref 850–3900)
MCH: 31.1 pg (ref 27.0–33.0)
MCHC: 33.8 g/dL (ref 32.0–36.0)
MCV: 92.2 fL (ref 80.0–100.0)
MPV: 11 fL (ref 7.5–12.5)
Monocytes Relative: 10.4 %
Neutro Abs: 3033 cells/uL (ref 1500–7800)
Neutrophils Relative %: 44.6 %
Platelets: 224 10*3/uL (ref 140–400)
RBC: 3.95 10*6/uL (ref 3.80–5.10)
RDW: 11.7 % (ref 11.0–15.0)
Total Lymphocyte: 41.9 %
WBC: 6.8 10*3/uL (ref 3.8–10.8)

## 2019-02-13 LAB — BASIC METABOLIC PANEL
BUN: 14 mg/dL (ref 7–25)
CO2: 30 mmol/L (ref 20–32)
Calcium: 9.8 mg/dL (ref 8.6–10.4)
Chloride: 104 mmol/L (ref 98–110)
Creat: 0.61 mg/dL (ref 0.50–1.05)
Glucose, Bld: 80 mg/dL (ref 65–99)
Potassium: 4 mmol/L (ref 3.5–5.3)
Sodium: 142 mmol/L (ref 135–146)

## 2019-02-13 LAB — LIPID PANEL
Cholesterol: 206 mg/dL — ABNORMAL HIGH (ref <200)
HDL: 57 mg/dL (ref >=50)
LDL Cholesterol (Calc): 133 mg/dL (calc) — ABNORMAL HIGH
Non-HDL Cholesterol (Calc): 149 mg/dL (calc) — ABNORMAL HIGH (ref <130)
Total CHOL/HDL Ratio: 3.6 (calc) (ref <5.0)
Triglycerides: 65 mg/dL (ref <150)

## 2019-02-13 LAB — TSH: TSH: 1.98 mIU/L (ref 0.40–4.50)

## 2019-02-15 ENCOUNTER — Encounter: Payer: Self-pay | Admitting: Family

## 2019-02-15 NOTE — Progress Notes (Signed)
Mailed out to pt 

## 2019-02-26 ENCOUNTER — Other Ambulatory Visit: Payer: Self-pay | Admitting: Neurology

## 2019-03-02 IMAGING — CR DG CHEST 2V
2 series · 2 of 2 positions shown · non-contrast
Comparison: 11/23/2017, 11/19/2017, 08/05/2017.

CLINICAL DATA: Chronic cough.  Current smoker.

EXAM:
CHEST - 2 VIEW

[w chest pa]
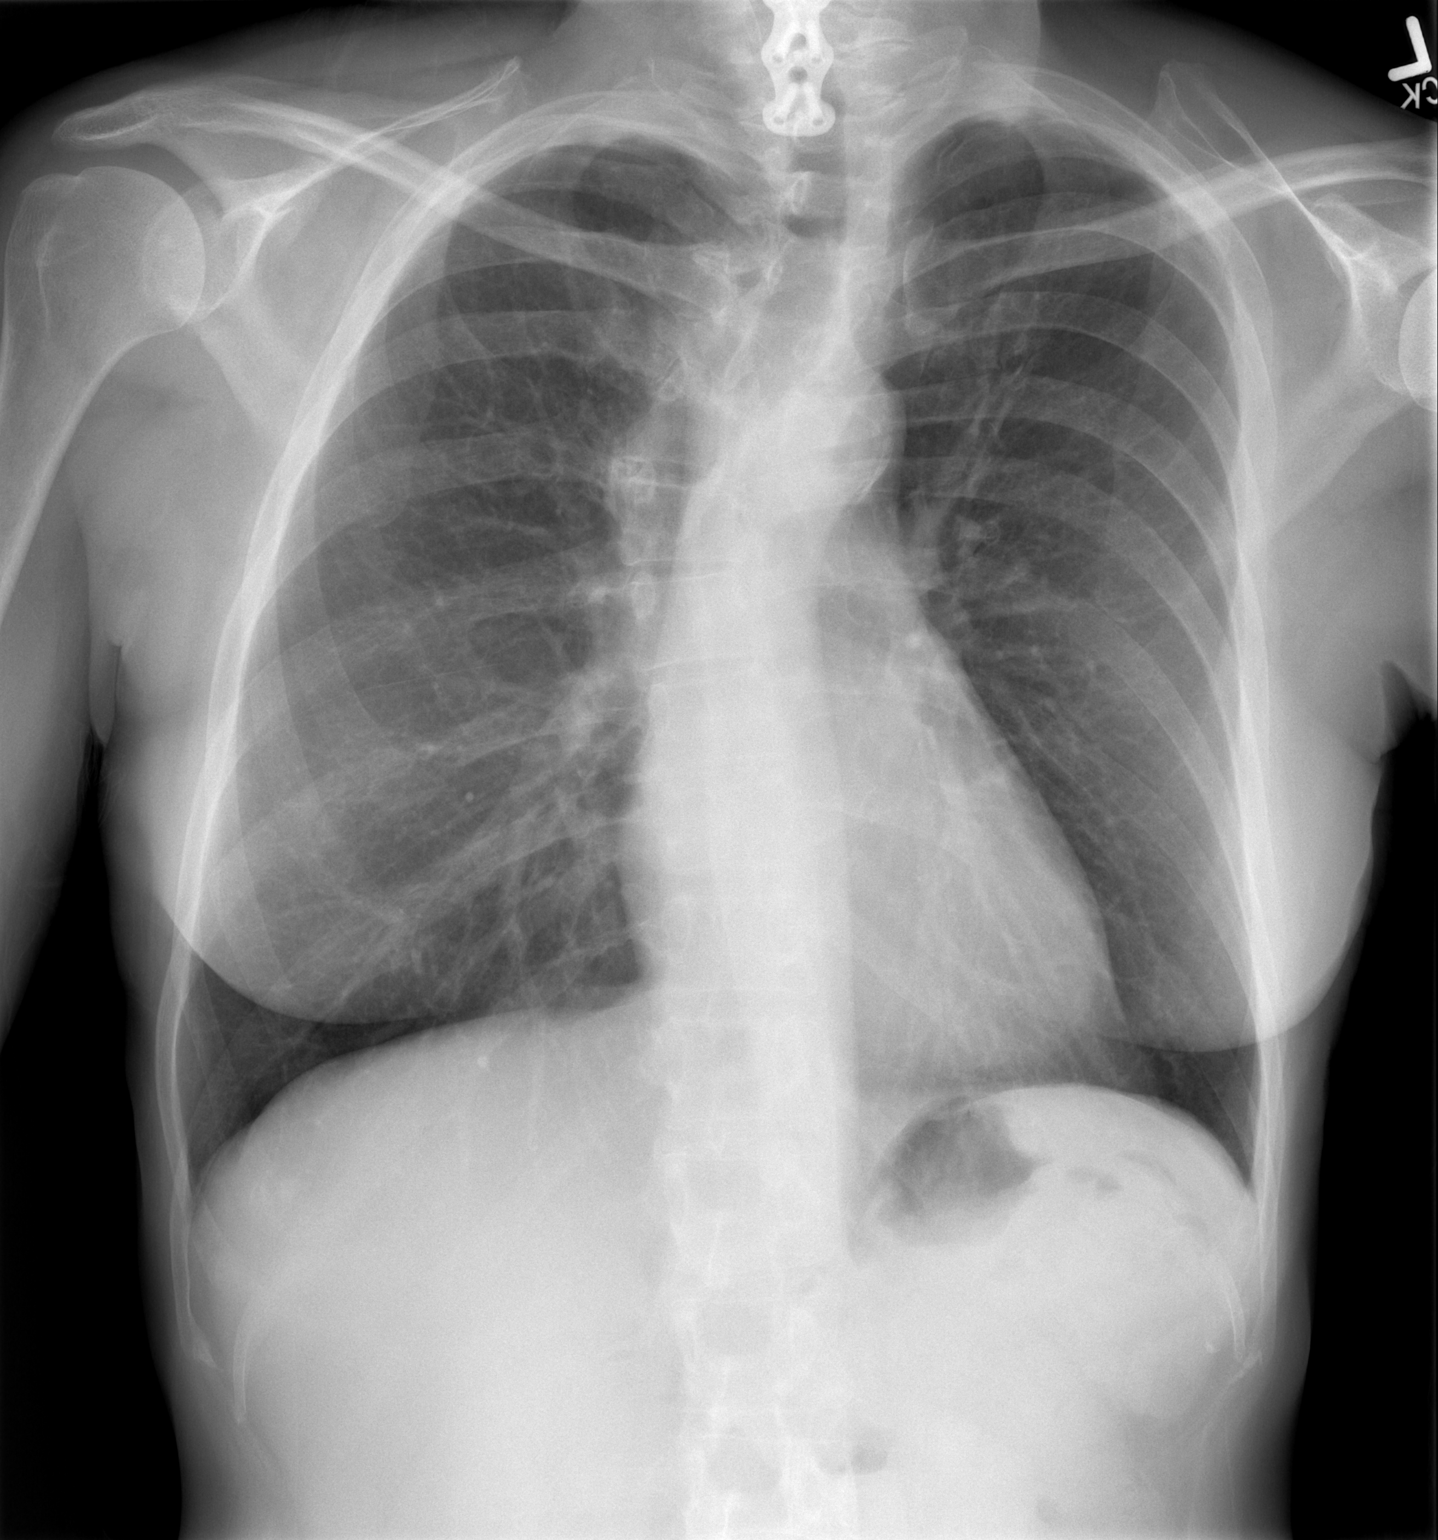

[w chest lat]
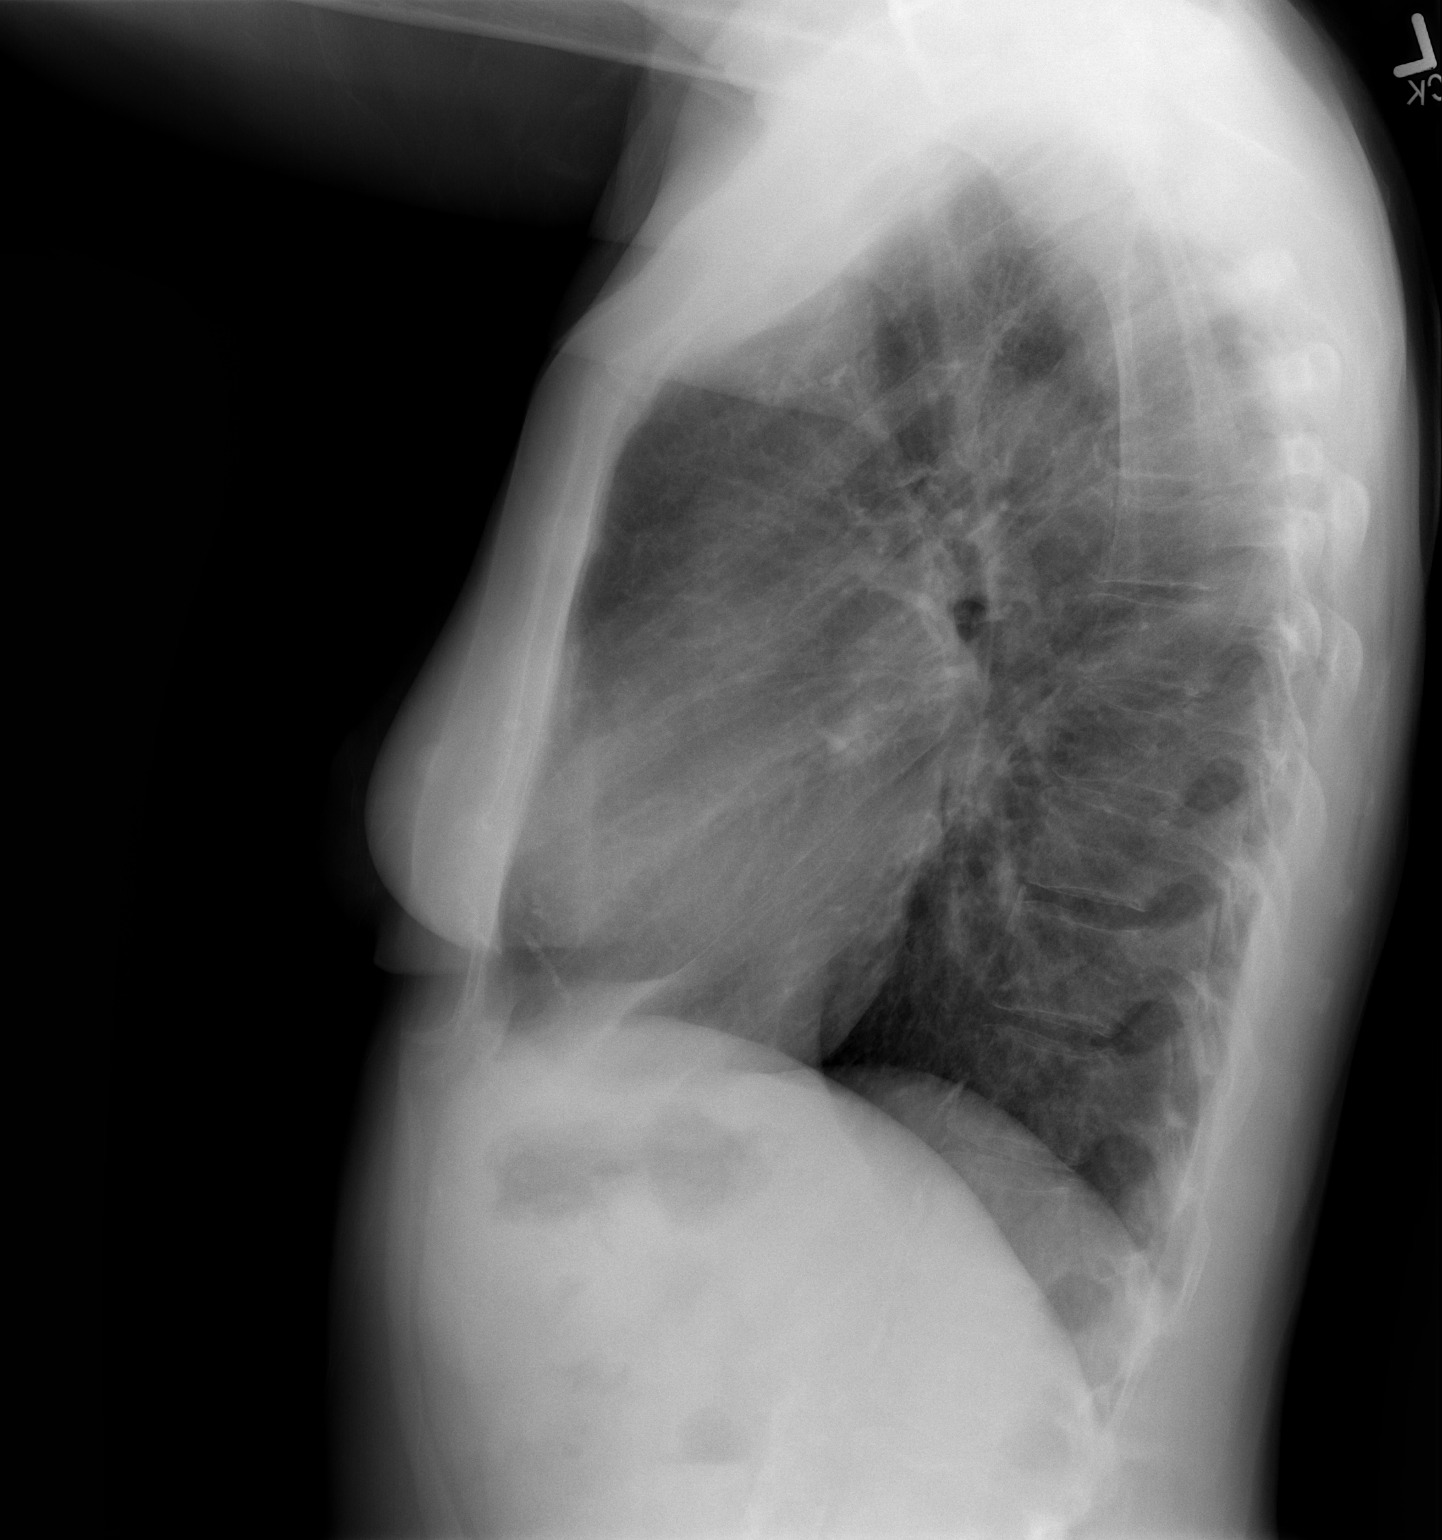

[2 of 2 positions shown; findings below may reference images not displayed]

FINDINGS: Cardiac silhouette normal in size, unchanged. Thoracic aorta
atherosclerotic, unchanged. Hilar and mediastinal contours otherwise
unremarkable. Stable mild hyperinflation. Lungs clear.
Bronchovascular markings normal. Pulmonary vascularity normal. No
visible pleural effusions. No pneumothorax. UPPER thoracic
dextroscoliosis.
IMPRESSION: Stable mild hyperinflation indicating COPD and/or asthma. No acute
cardiopulmonary disease.

## 2019-05-22 ENCOUNTER — Other Ambulatory Visit: Payer: Self-pay | Admitting: Neurology

## 2019-06-18 ENCOUNTER — Other Ambulatory Visit: Payer: Self-pay | Admitting: Neurology

## 2019-07-03 ENCOUNTER — Other Ambulatory Visit: Payer: Self-pay | Admitting: Neurology

## 2019-07-05 ENCOUNTER — Telehealth: Payer: Self-pay | Admitting: Neurology

## 2019-07-05 ENCOUNTER — Other Ambulatory Visit: Payer: Self-pay | Admitting: Neurology

## 2019-07-05 DIAGNOSIS — R413 Other amnesia: Secondary | ICD-10-CM

## 2019-07-05 NOTE — Telephone Encounter (Signed)
Called patient and left a message per Dr. Karel Jarvis:  Pls call husband and see if they still want to do Neurocognitive testing, and that we are doing it in our office now. Pls schedule if yes.

## 2019-07-05 NOTE — Telephone Encounter (Signed)
Scheduled

## 2019-07-27 ENCOUNTER — Ambulatory Visit: Payer: 59

## 2019-07-27 ENCOUNTER — Encounter: Payer: Self-pay | Admitting: Counselor

## 2019-07-27 ENCOUNTER — Ambulatory Visit (INDEPENDENT_AMBULATORY_CARE_PROVIDER_SITE_OTHER): Payer: 59 | Admitting: Counselor

## 2019-07-27 ENCOUNTER — Other Ambulatory Visit: Payer: Self-pay

## 2019-07-27 DIAGNOSIS — R413 Other amnesia: Secondary | ICD-10-CM

## 2019-07-27 DIAGNOSIS — F04 Amnestic disorder due to known physiological condition: Secondary | ICD-10-CM

## 2019-07-27 NOTE — Progress Notes (Signed)
   Psychometrist Note   Cognitive testing was administered to Kari Hoffman by Lamar Benes, B.S. (Technician) under the supervision of Alphonzo Severance, Psy.D., ABN. [name] was able to tolerate all test procedures. Dr. Nicole Kindred met with the patient as needed to manage any emotional reactions to the testing procedures (if applicable). Rest breaks were offered.    The battery of tests administered was selected by Dr. Nicole Kindred with consideration to the patient's current level of functioning, the nature of her symptoms, emotional and behavioral responses during the interview, level of literacy, observed level of motivation/effort, and the nature of the referral question. This battery was communicated to the psychometrist. Communication between Dr. Nicole Kindred and the psychometrist was ongoing throughout the evaluation and Dr. Nicole Kindred was immediately accessible at all times. Dr. Nicole Kindred provided supervision to the technician on the date of this service, to the extent necessary to assure the quality of all services provided.    [name] will return in approximately one week for an interactive feedback session with Dr. Nicole Kindred, at which time female test performance, clinical impressions, and treatment recommendations will be reviewed in detail. The patient understands she can contact our office should she require our assistance before this time.   A total of 100 minutes of billable time were spent with Kari Hoffman by the technician, including test administration and scoring time. Billing for these services is reflected in Dr. Les Pou note.   This note reflects time spent with the psychometrician and does not include test scores, clinical history, or any interpretations made by Dr. Nicole Kindred. The full report will follow in a separate note.

## 2019-07-27 NOTE — Progress Notes (Signed)
NEUROPSYCHOLOGICAL EVALUATION [facility]  Patient Name: Kari Hoffman MRN: [id number] Date of Birth: 30-Sep-1960 Age: 59 y.o. Education: 11 years (GED)  Referral Circumstances and Background Information  Kari Hoffman is a 59 y.o., right-hand dominant, married woman who experienced a confusional episode (Wernicke's vs. UTI, she did have gait disturbance) in April, 2019 when her husband found her leaning up against the tub, unable to get up, incontinent of stool and urine. He reported that she had been "sleeping and out of it" for 1 to 2 weeks prior, demonstrating symptoms such as not remembering what they had eaten the meal before, and asking why they moved to [place] (they moved to [place] in 1996). As per records associated with that hospital admission, he had noticed more subtle changes starting in February/March of 2019 including balance problems. Since then, she has been followed by Dr. Karel Jarvis who did extensive workup including laboratory and CSF testing, which was largely unrevealing. She presents for neuropsychological evaluation in the service of diagnostic clarification and characterizing her current cognitive status.     On interview, the patient stated that she is aware of her memory problems and would "like to get back to normal." "I remember I'm married, I remember my children, but I can't tell you what I did yesterday." Her husband denied that she repeats herself or that she asks the same questions over and over again although it sounds like she does forget most things over the course of a day (he stated that she is likely to forget that she came here today by the evening). She does forget entire events at times. The patient says that she forgets dates unless she looks at a calendar. They denied any significant personality changes, any compulsive or repetitive behaviors, or hoarding. They denied any problems with language or word finding. They denied any mistaken beliefs, delusions, or  hallucinations. With respect to mood, the patient stated that she has "been ok," although she would like to go back to work. Her husband doesn't think she would be able to. They are thinking about applying for disability. The patient stated that her energy is good, and she is sleeping well. She wasn't sure how much sleep she was getting because she has a hard time remembering (her husband said at least 7 hours but he goes to work early so doesn't know when she gets up). She appeared tearful at times when discussing her memory problems.   With respect to functioning, they stated that her walking is fairly good and she doesn't have any recurrent falls or balance problems. Her husband has started managing the finances, which he took over from her after her hospitalization. She isn't driving, she stopped after the hospitalization. The patient is still doing some things around the house, like cleaning and laundry, she doesn't cook much although it's not clear if that's due to lack of ability or choice. Her husband helps with medication, he isn't sure if she would forget them if he didn't remind her. Her husband typically manages her medical appointments.   Past Medical History and Review of Relevant Studies   Patient Active Problem List   Diagnosis Date Noted  . Altered mental status 08/05/2017  . Acute encephalopathy 08/05/2017  . Routine general medical examination at a health care facility 12/13/2013  . H/O alcohol abuse   . Ejection fraction   . Depression 03/16/2013  . Weight gain 03/16/2013  . Multiple thyroid nodules 03/16/2013  . Low back pain 03/16/2013  .  Atrial flutter (HCC) 03/10/2013  . Alcohol abuse 03/10/2013  . Screening for hyperlipidemia 12/15/2012  . TOBACCO ABUSE 02/08/2010    Review of Neuroimaging and Relevant Studies:  The patient has an MRI of the brain from 02/07/2018 that shows fairly normal brain volume and morphology for age. There are no obvious areas of hyperintensity  in the periaqueductal grey, thalami, fornices, or other areas often affected in Wernicke's encephalopathy. The mamillary bodies are not well visualized but do not appear obviously atrophic as assessed by visual inspection of saggital T1W and axial T2W series. There is a minimal burden of leukoaraiosis, not enough to cause and doubtful enough to contribute significantly to cognitive problems.   MMSE  01/21/2018 - 18/30  Montreal Cognitive Assessment  04/08/2018  Visuospatial/ Executive (0/5) 3  Naming (0/3) 2  Attention: Read list of digits (0/2) 2  Attention: Read list of letters (0/1) 1  Attention: Serial 7 subtraction starting at 100 (0/3) 0  Language: Repeat phrase (0/2) 0  Language : Fluency (0/1) 0  Abstraction (0/2) 1  Delayed Recall (0/5) 0  Orientation (0/6) 4  Total 13  Adjusted Score (based on education) 14   Current Outpatient Medications  Medication Sig Dispense Refill  . donepezil (ARICEPT) 10 MG tablet TAKE  1 TAB AT BEDTIME 30 tablet 4  . Multiple Vitamin (MULTIVITAMIN) capsule Take 1 capsule by mouth daily.    . valACYclovir (VALTREX) 1000 MG tablet Take 1 tablet (1,000 mg total) by mouth 3 (three) times daily. 21 tablet 0   No current facility-administered medications for this visit.   Family History  Problem Relation Age of Onset  . Cancer Mother        bone  . Leukemia Father   . Diabetes Brother        type 2  . Coronary artery disease Neg Hx     There is no  family history of dementia. There is no  family history of psychiatric illness.  Psychosocial History  Developmental, Educational and Employment History: The patient had a hard time with her educational history. She eventually stated she was average but didn't sound very confident. She stated that she was held back after I asked if she was held back but also stated that she skipped a grade after I asked about that (confabulation?). She eventually stated that she was held back but then caught back up  but wasn't really sure. She did drop out of school in 11th grade, she thinks, but she isn't sure. She earned her GED but wasn't sure when, her husband thought it was immediately after high school. 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    Psychiatric History: The patient has a chart diagnosis of depression, she said that she doesn't remember being depressed. Her husband doesn't recall her having any difficulties with depression either, consulting with psychiatrists, or psychologists.   Substance Use History: The patient and her husband stated that they used to drink about 6 - 8 drinks most nights prior to her hospitalization, for most of the time they have been together (25 years). Her husband stated that he had a heart attack in November, 2017 and after his wife's hospitalization, they both quit. She used to smoke about 1ppd for at least 24 years and quit sometime after her hospitalization and does not smoke currently.   Relationship History and Living Cimcumstances: The patient and her husband have been married for 24 years. She has two children,  one of whom is in [place] and one of whom is in [place].   Mental Status and Behavioral Observations  Sensorium/Arousal: The patient's level of arousal was awake and alert. Hearing and vision were adequate for testing purposes. Orientation: The patient was generally oriented to person, place, and situation but she was off on the specific date (4th), the day of the week, and the floor.  Appearance: Dressed in appropriate, casual clothing.  Behavior: Appropriate and appeared outwardly compliant with the evaluation Speech/language: The patient's speech was normal in rate, rhythm, and volume without word finding or paraphasic errors.  Gait/Posture: Normal at last neurological appointment  Movement: No obvious signs of movement disorder such as adventitious movements, masked facies, or tremor noted on observation Social  Comportment: Appropriate if not a bit withdrawn Mood: Patient stated that she generally feels good Affect: Restricted to poker faced Thought process/content: The patient's thought process was logical and linear although she had a hard time with her personal history. Her thought content was appropriate and pertinent to the topics discussed.  Safety: No safety concerns identified at today's visit Insight: Fair  MMSE - Mini Mental State Exam 07/27/2019  Orientation to time 3  Orientation to Place 4  Registration 3  Attention/ Calculation 4  Recall 2  Language- name 2 objects 2  Language- repeat 1  Language- follow 3 step command 3  Language- read & follow direction 1  Write a sentence 1  Copy design 0  Total score 24   MMSE Using Serial 7's: 23/30  Test Procedures  Wide Range Achievement Test - 4             Word Reading Neuropsychological Assessment Battery  List Learning  Story Learning  Daily Living Memory  Naming  Digit Span Repeatable Battery for the Assessment of Neuropsychological Status (Form A)  Figure Copy  Judgment of Line Orientation  Coding  Figure Recall The Dot Counting Test Rey 15-Item and Recognition Trial A Random Letter Test Controlled Oral Word Association (F-A-S) Semantic Fluency (Animals) Trail Making Test A & B Complex Ideational Material Patient Health Questionnaire- 9 GAD-7 Functional Activities Questionnaire  Plan  LISIA WESTBAY was seen for a psychiatric diagnostic evaluation and neuropsychological testing. Her history is fairly compelling for Wernicke-Korsakoff syndrome given her confusional episode and prominent memory loss. She did appear to have some mild confabulation on exam and possibly also during history giving. She would like to return to work but I have concerns about her ability to do so successfully. Testing will be helpful in characterizing her current level of functioning and informing her care plan. Full and complete note with  impressions, recommendations, and interpretation of test data to follow.   Bettye Boeck Roseanne Reno, PsyD, ABN Clinical Neuropsychologist  Informed Consent and Coding/Compliance  Risks and benefits of the evaluation were discussed with the patient as were the limits of confidentiality. I conducted a clinical interview and neuropsychological testing (more than two tests) with Philis Kendall and Clare Charon, B.S. (Technician) assisted me in administering additional test procedures. The patient was able to tolerate the testing procedures and the patient (and/or family) is likely to benefit from further follow up to receive the diagnosis and treatment recommendations, which will be rendered at the next encounter. Billing below reflects technician time, my direct face-to-face time with the patient, time spent in test administration, and time spent in professional activities including but not limited to: neuropsychological test interpretation, integration of neuropsychological test data with clinical history, report  preparation, treatment planning, care coordination, and review of diagnostically pertinent medical history or studies.   Services associated with this encounter: Clinical Interview [phone]) plus 60 minutes (54656; Neuropsychological Evaluation by Professional)  120 minutes (81275; Neuropsychological Evaluation by Professional, Adl.) 30 minutes (17001; Test Administration by Professional) 30 minutes ([id number]; Neuropsychological Testing by Technician) 70 minutes ([id number]; Neuropsychological Testing by Technician, Adl.)

## 2019-07-31 IMAGING — XA DG FLUORO GUIDE NDL PLC/BX
1 series · 1 of 1 positions shown · non-contrast
Comparison: none

CLINICAL DATA: Unexplained and progressive dementia.

[Series 1: ortho standard · 1 of 1 slices shown]
[im 1/1]
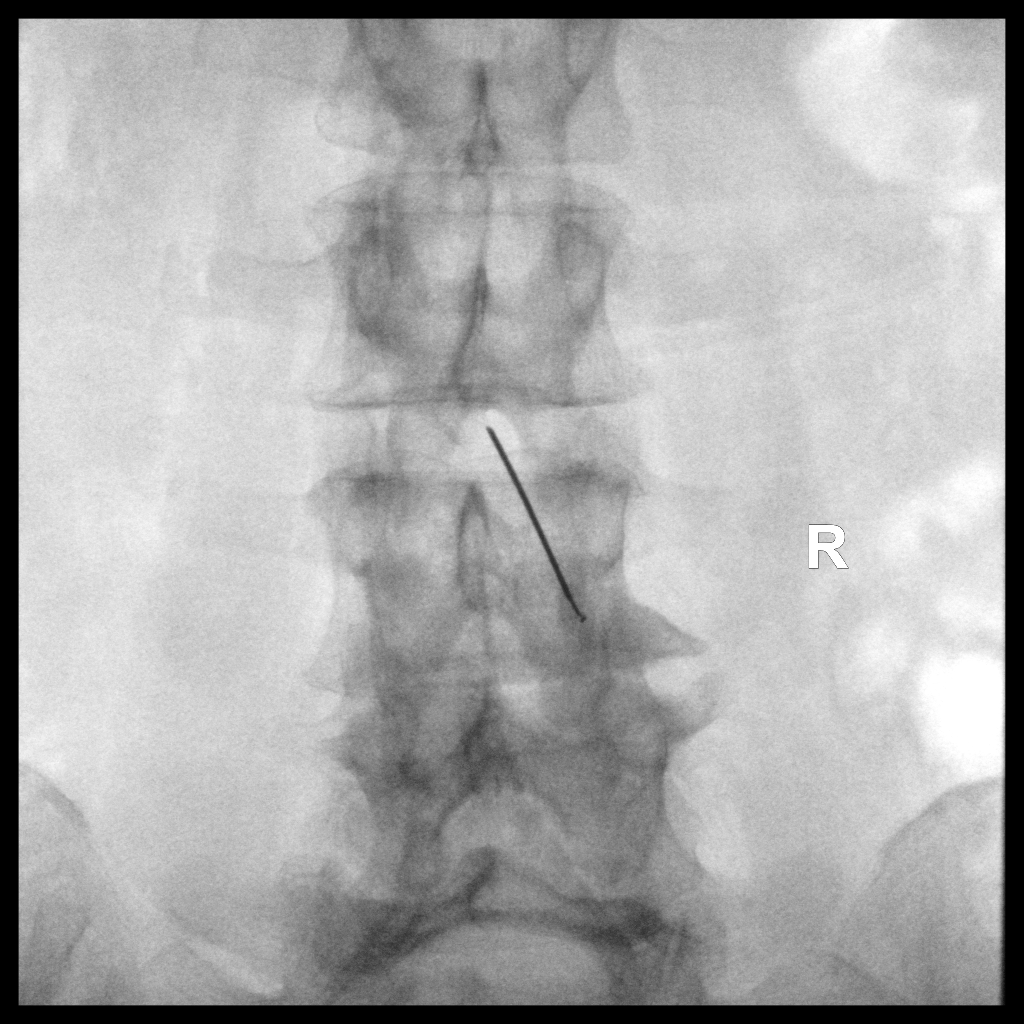

[1 of 1 positions shown; findings below may reference images not displayed]

EXAM:
DIAGNOSTIC LUMBAR PUNCTURE UNDER FLUOROSCOPIC GUIDANCE

FLUOROSCOPY TIME:  Fluoroscopy Time: 0 minutes 13 seconds.
micro gray meter squared

PROCEDURE:
Informed consent was obtained from the patient prior to the
procedure, including potential complications of headache, allergy,
and pain. With the patient prone, the lower back was prepped with
Betadine. 1% Lidocaine was used for local anesthesia. Lumbar
puncture was performed at the right L2-3 level using a 20 gauge
needle with return of clear CSF with an opening pressure of 12 cm
water. Eighteen ml of CSF were obtained for laboratory studies, and
submitted in 6 different tubes for multiple studies as requested.
The patient tolerated the procedure well and there were no apparent
complications.
IMPRESSION: Lumbar puncture on the right at L2-3. 18 cc of clear CSF collected
and submitted for requested studies in 6 tubes.

## 2019-08-02 NOTE — Progress Notes (Signed)
[facility]  Patient Name: Kari Hoffman MRN: [id number] Date of Birth: Aug 21, 1960 Age: 59 y.o. Education: 11 years (GED)  Measurement properties of test scores: IQ, Index, and Standard Scores (SS): Mean = 100; Standard Deviation = 15 Scaled Scores (Ss): Mean = 10; Standard Deviation = 3 Z scores (Z): Mean = 0; Standard Deviation = 1 T scores (T); Mean = 50; Standard Deviation = 10  TEST SCORES:    Note: This summary of test scores accompanies the interpretive report and should not be considered in isolation without reference to the appropriate sections in the text. Test scores are relative to age, gender, and educational history as available and appropriate.   Performance Validity        Rey 15 and Recognition: Raw  Descriptor      Free Recall 11 Within Expectation      Recognition 8 ---      False Positives 0 ---      Combined Score 19 Below Expectation      A Random Letter Test Raw  Descriptor      Errors 0 Within Normal Limits  The Dot Counting Test: 14 Below Expectation      Mental Status Screening     "World" Serial 7's  MMSE 24 23      Expected Functioning        Wide Range Achievement Test: Standard/Scaled Score Percentile      Word Reading 77 6      Attention/Processing Speed        Neuropsychological Assessment Battery (Attention Module, Form 1): T-score Percentile      Digits Forward 46 34      Digits Backwards 47 38      Repeatable Battery for the Assessment of Neuropsychological Status (Form A): Scaled Score Percentile      Coding 6 9      Language        Neuropsychological Assessment Battery (Language Module, Form 1): T-score Percentile      Naming 56 73      Verbal Fluency: T-score Percentile      Controlled Oral Word Association (F-A-S) 36 8      Semantic Fluency (Animals) 26 1      Memory:        Neuropsychological Assessment Battery (Memory Module, Form 1): T-score Percentile      List Learning          List A Immediate Recall   (3, 4, 4) 28 2         List B Immediate Recall   (2) 37 9         List A Short Delayed Recall   (0) 21 <1         List A Long Delayed Recall   (0) 27 1         List A Long Delayed Yes/No Recognition Hits   (8) -- 2         List A Long Delayed Yes/No Recognition False Alarms   (3) -- 38         List A Recognition Discriminability Index -- 14     Story Learning           Immediate Recall   (14, 20) 30 2         Delayed Recall   (7) 31 3      Daily Living Memory  Immediate Recall   (21, 13) 39 14          Delayed Recall   (5, 0) 20 <1          Recognition Hits -- <1      Repeatable Battery for the Assessment of Neuropsychological Status (Form A): Scaled Score Percentile         Figure Recall   (2) 1 <1      Visuospatial/Constructional Functioning        Repeatable Battery for the Assessment of Neuropsychological Status (Form A): Standard/Scaled Score Percentile     Visuospatial/Constructional Index 84 14         Figure Copy 6 9         Judgment of Line Orientation -- 26-50      Executive Functioning        Modified Wisconsin Card Sorting Test (MWCST): Standard/T-Score Percentile      Number of Categories Correct 30 3      Number of Perseverative Errors 31 3      Number of Total Errors 24 1      Percent Perseverative Errors 41 19  Executive Function Composite 68 2      Trail Making Test: T-Score Percentile      Part A 44 27      Part B 55 69      Boston Diagnostic Aphasia Exam: Raw Score Scaled Score      Complex Ideational Material 11 9      Clock Drawing Raw Score Descriptor      Command 7 Mild Impairment      Rating Scales         Raw Score Descriptor  Functional Activities Questionaire 13 Impaired  Patient Health Questionnaire - 9 0 WNL  GAD-7 3 Minimal    Peter V. Roseanne Reno PsyD, ABN Clinical Neuropsychologist

## 2019-08-03 ENCOUNTER — Ambulatory Visit (INDEPENDENT_AMBULATORY_CARE_PROVIDER_SITE_OTHER): Payer: 59 | Admitting: Counselor

## 2019-08-03 ENCOUNTER — Encounter: Payer: Self-pay | Admitting: Counselor

## 2019-08-03 ENCOUNTER — Other Ambulatory Visit: Payer: Self-pay

## 2019-08-03 DIAGNOSIS — F0150 Vascular dementia without behavioral disturbance: Secondary | ICD-10-CM | POA: Diagnosis not present

## 2019-08-03 DIAGNOSIS — F04 Amnestic disorder due to known physiological condition: Secondary | ICD-10-CM

## 2019-08-03 DIAGNOSIS — F0390 Unspecified dementia without behavioral disturbance: Secondary | ICD-10-CM

## 2019-08-03 NOTE — Patient Instructions (Signed)
Your performance and presentation on assessment were consistent with significant difficulties on measures of memory and on select measures of executive function. Your memory problems look amnestic, meaning you had a hard time getting information in and once it was in, it was forgotten over time. You did, however, remember some information across time and seemed to benefit from recognition cuing on certain areas so there is a bit of residual storage capacity. 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   There is much debate about whether and to what extent drinking alcohol can damage cognitive abilities over time. What we do know is that alcohol consumption, often in conjunction with insufficient diet, can result in nutrient deficiencies including not enough thiamine. This can cause a metabolic problem in the brain known as Wernicke's encephalopathy, which is usually characterized by a triad of difficulties walking, confusion, and eye movement abnormalities. My guess is that this is the most likely cause of your confusional episode back in 2019. When the nutritional deficiency is corrected swiftly, sometimes, there is not lasting damage but other times people have ongoing cognitive problems. Unfortunately, I think that you have lasting impairment in memory and other cognitive abilities. Most often memory is the primary difficulty in this type of condition, which is consistent with your test findings and your clinical history.   Cognitive problems are typically divided into two major categories; mild neurocognitive disorder and major neurocognitive disorder (also referred to as dementia). Individuals with mild neurocognitive disorder are still able to be fully independent whereas those with major neurocognitive disorder require some help day-to-day. 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 Major neurocognitive disorder is a term for cognitive difficulties that are sufficiently severe they impair your capacity to be totally independent (I.e., you require some help with daily activities).   The test findings are viewed as supportive of disability status; I do not think it is possible for you to perform in a competitive employment situation related to the degree of cognitive problems.   Fortunately, your cognitive issues do not have to worsen over time. Continuing to abstain from alcohol is likely to be helpful. As a general recommendation, I would suggest that you engage in health lifestyle behaviors including getting recommended (usually 8 hours) amounts of sleep, eating a healthy diet rich in unprocessed foods such as vegetables and whole grains, assertively monitor and manage underlying medical conditions that can contribute to cognitive impairment (e.g., hypertension, high cholesterol, etc.), getting regular exercise (preferably 20-30 minutes a day), and engaging in satisfying social interactions with others.   I would recommend that your husband provide you with assistance as needed with memory dependent activities. This includes things like managing money, taking medications, and attending doctors appointments. He is already doing many of those things.   You denied much in the way of depressive or anxious symptoms on interview, although some level of negative affect is expected and understandable given the numerous changes you have dealt with in your health status and life as of late. We will discuss a referral for psychotherapy.   The following compensatory strategies may be helpful for managing day-to-day memory symptoms:  Minimize distractions and interruptions to the extent possible. Be an active observer, present-minded, and focus attention. Focus on only one task for a period of time.   Get organized. Establish routines and stick to them. Make and use  checklists.  Use external memory aids as needed, such as a planner and notebook. Repetition, written reminders, and keeping a calendar of appointments may be helpful.  Designate a place to keep your keys, wallet, cell phone, and other personal belongings.   Break down tasks into smaller steps to help get started and to keep from feeling overwhelmed.   Increase your success learning information by breaking it into manageable chunks, connecting it to previously learned information, or forming associations with what you are trying to remember.

## 2019-08-03 NOTE — Progress Notes (Signed)
NEUROPSYCHOLOGICAL EVALUATION [facility]  Patient Name: Kari Hoffman MRN: [id number] Date of Birth: 09-27-1960 Age: 59 y.o. Education: 11 years (GED)  Clinical Impressions  Kari Hoffman is a 59 y.o., right-hand dominant, married woman who experienced a confusional episode with gait disturbance in April, 2019, raising concerns about the possibility of Wernicke's encephalopathy. She did also have a UTI and sepsis at the time. Her husband noticed subtle cognitive symptoms going back to February/March prior to that and she has not regained her previous baseline since the hospitalization. She has been worked up extensively by Dr. Karel Jarvis including an LP, which was unrevealing. She has an MRI of the brain from 02/07/2018 that was unremarkable.   There is evidence from neuropsychological testing of objective changes on measures of memory, speed of processing, and select measures of executive functioning. The memory profile appears to be amnestic with diminished encoding of information and very minimal retention of information across time showing on both visual and verbal indicators. She performed relatively well on measures of attention/working memory and visuospatial/constructional functioning. She reported minimal levels of anxiety and depressive symptoms and was characterized as having significant, day-to-day functional difficulties on the basis of cognitive problems by her husband.   The overall impression is one of significant impairment mainly affecting memory and executive abilities. 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   Diagnostic Impressions: Wernicke-Korsakoff Syndrome  Recommendations to be discussed with patient  Your performance and presentation on assessment were consistent with significant difficulties  on measures of memory and executive function. Your memory problems look amnestic, meaning you had a hard time getting information in and once it was in, it was forgotten over time. 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   There is much debate about whether and to what extent drinking alcohol can damage cognitive abilities over time. What we do know is that alcohol consumption, often in conjunction with insufficient diet, can result in nutrient deficiencies including not enough Thiamine. This can cause a metabolic problem in the brain known as Wernicke's encephalopathy, which is usually characterized by a triad of difficulties walking, confusion, and eye movement abnormalities. My guess is that this is the most likely cause of your confusional episode back in 2019. When the nutritional deficiency is corrected swiftly, sometimes, there is not lasting damage but other times people have ongoing cognitive problems. Unfortunately, I think that you have lasting impairment in memory and other cognitive abilities. Most often memory is the primary difficulty in this type of condition, which is consistent with your test findings and your clinical history.   Cognitive problems are typically divided into two major categories; mild neurocognitive disorder and major neurocognitive disorder (also referred to as dementia). Individuals with mild neurocognitive disorder are still able to be fully independent whereas those with major neurocognitive disorder require some help day-to-day. Your diagnosis is major neurocognitve disorder. Major neurocognitive disorder is a term for cognitive difficulties that are sufficiently severe they impair your capacity to be totally independent (I.e., you require some help with daily activities).   The test findings are viewed as supportive of  disability status; I do not think it is possible for you to perform in a competitive employment situation related  to the degree of cognitive problems.   Fortunately, your cognitive issues do not have to worsen over time. It is of paramount importance that you continue to abstain from using alcohol. As a general recommendation, I would suggest that you engage in health lifestyle behaviors including getting recommended (usually 8 hours) amounts of sleep, eating a healthy diet rich in unprocessed foods such as vegetables and whole grains, assertively monitor and manage underlying medical conditions that can contribute to cognitive impairment (e.g., hypertension, high cholesterol, etc.), getting regular exercise (preferably 20-30 minutes a day), and engaging in satisfying social interactions with others.   I would recommend that your husband provide you with assistance as needed with memory dependent activities. This includes things like managing money, taking medications, and attending doctors appointments. He is already doing many of those things.   You denied much in the way of depressive or anxious symptoms on interview, although some level of negative affect is expected and understandable given the numerous changes you have dealt with in your health status and life as of late. We will discuss a referral for psychotherapy.   The following compensatory strategies may be helpful for managing day-to-day memory symptoms: . Minimize distractions and interruptions to the extent possible. Be an active observer, present-minded, and focus attention. Focus on only one task for a period of time.  . Get organized. Establish routines and stick to them. Make and use checklists.  . Use external memory aids as needed, such as a planner and notebook. Repetition, written reminders, and keeping a calendar of appointments may be helpful. Charlene Brooke a place to keep your keys, wallet, cell phone, and other personal  belongings.  . Break down tasks into smaller steps to help get started and to keep from feeling overwhelmed.  . Increase your success learning information by breaking it into manageable chunks, connecting it to previously learned information, or forming associations with what you are trying to remember.   Test Findings  Test scores are summarized in additional documentation associated with this encounter. Test scores are relative to age, gender, and educational history as available and appropriate. There were no concerns about performance validity despite one score falling at the margin of expectations for her. Because this task is memory dependent, I think it more likely reflects primary memory difficulties as opposed to a performance validity problem per se.    General Intellectual Functioning/Achievement:  Performance on single word reading fell at an unusually low level, equlivalent to a 5.8th grade level, which tempers expectations for the patient's cognitive test performance to some extent.   Attention and Processing Efficiency: Performance on indicators of attention and working memory was well preserved for the most part with average scores on indicators of digit repetition forward and backward.   On timed measures of processing efficiency, she had difficulties, with an unusually low score on timed number-symbol coding. She did get an average score on simple numeric sequencing but that is a very easy test.   Language: Performance was normal on visual object confrontation naming. By contrast, timed verbal fluency was low with unusually low phonemic fluency and extremely low semantic fluency.   Visuospatial Function: Performance on visuospatial and constructional measures was an area of relative strength and fell in the low average range on the visuospatial/constructional index of the RBANS. Copy of a line drawing was unusually low although that was due to some sloppiness with figure details  and omission of one detail. As such, it may reflect more of  an executive problem than a visuospatial problem per se. Her performance was average on a putative "pure" perceptual indicator involving judgment of angular line orientations.   Learning and Memory: Learning and memory test findings suggested significant storage difficulties with impaired encoding of information and minimal retention across time. She was able to recall some information and got benefit from recognition cuing in some instances, suggesting a bit of residual storage capacity.   In the verbal realm, immediate recall for a 12-item word list was 3, 4, and 4 words across three trials of a 12-item word list followed by 0 words at short and long delayed recall intervals. Recognition cuing did benefit her performance and she achieved a low average score when identifying target words from amongst distractor alternate items. Immediate recall for a short story was unusually low with comparable delayed recall although her retention of information was low over time. Memory for brief daily-living type information was low average followed by poor retention of information and extremely low delayed recall. In this case, recognition cuing was not helpful, with an extremely low recognition score.   Delayed recall for a modestly complex figure was extremely low, with very few details retained.   Executive Functions: Performance was low on select indicators of executive function including a rule-based, categorization procedure involving card sorting, on which her Executive Function Composite was extremely low. She scored in the unusually low range for both categories completed and perseverative errors. She also achieved an unusually low score on generation of words in response to the letters F-A-S. Clock drawing was suggestive of "Mild Impairment" with minor errors in spatial arrangement of the numbers and hands markedly out of course, set at 10 to 11  instead of 10 past 11. She did much better when listening to and then reasoning with verbally presented information on the Complex Ideational Material.   Rating Scale(s): Ms. Skousen screened negative for the presence of clinically significant anxiety or depressive symptoms on self-rating scales. She was characterized as having substantial functional difficulties day-to-day by her husband, with an overall score in the major neurocognitive disorder range albeit with only one category rated as dependent (for traveling out of the neighborhood, driving, or arranging to take busses.   Viviano Simas Nicole Kindred PsyD, [place] Clinical Neuropsychologist

## 2019-08-03 NOTE — Progress Notes (Signed)
   Kari Hoffman  I met with Kari Hoffman to review the findings resulting from her neuropsychological evaluation. Since the last appointment, she has been about the same.Time was spent reviewing the impressions and recommendations that are detailed in the evaluation report. We discussed alcohol related nutrient deficiencies, wernicke-korsakoff syndrome, expected prognosis and appropriate compensatory strategies. I shared that the findings of her evaluation are supportive of disability status. Interventions provided during this encounter included psychoeducation, supportive counseling and other topics as reflected in the patient instructions. I took time to explain the findings and answer all the patient's questions. I encouraged Kari Hoffman to contact me should she have any further questions or if further follow up is desired.   Current Medications and Medical History   Current Outpatient Medications  Medication Sig Dispense Refill  . donepezil (ARICEPT) 10 MG tablet TAKE  1 TAB AT BEDTIME 30 tablet 4  . Multiple Vitamin (MULTIVITAMIN) capsule Take 1 capsule by mouth daily.    . valACYclovir (VALTREX) 1000 MG tablet Take 1 tablet (1,000 mg total) by mouth 3 (three) times daily. 21 tablet 0   No current facility-administered medications for this visit.    Patient Active Problem List   Diagnosis Date Noted  . Altered mental status 08/05/2017  . Acute encephalopathy 08/05/2017  . Routine general medical examination at a health care facility 12/13/2013  . H/O alcohol abuse   . Ejection fraction   . Depression 03/16/2013  . Weight gain 03/16/2013  . Multiple thyroid nodules 03/16/2013  . Low back pain 03/16/2013  . Atrial flutter ([place]) 03/10/2013  . Alcohol abuse 03/10/2013  . Screening for hyperlipidemia 12/15/2012  . TOBACCO ABUSE 02/08/2010    Mental Status and Behavioral Observations  Kari Hoffman was available on time for this telephonic appointment and  was attended by her husband Kari Hoffman. She was alert and fully oriented. Her self-reported mood was "I'm good with mood" and her affect was neutral as assessed by vocal quality. Her thought process was logical, linear, and goal-oriented. Her thought content was appropriate to the topics discussed.   Plan  Feedback provided regarding the patient's neuropsychological evaluation. I shared with Kari Hoffman that my recommendation would be for her to pursue disability although that does not mean that she is completely unable to do some work. She would like to return to work, although any memory dependent acitvity is likely to be challenging at this point in time, in my opinion. We discussed volunteer opportunities and health lifestyle changes. She is already on Aricept. I offered psychotherapy but she wanted to consider it. They requested that I send a copy of the AVS to her at her home. Kari Hoffman was encouraged to contact me if any questions arise or if further follow up is desired.   Kari Hoffman Nicole Kindred, PsyD, ABN Clinical Neuropsychologist  Service(s) Provided at This Encounter: 50 minutes [phone]; Conjoint therapy with patient present)

## 2019-09-26 ENCOUNTER — Other Ambulatory Visit: Payer: Self-pay | Admitting: Neurology

## 2019-10-26 ENCOUNTER — Other Ambulatory Visit: Payer: Self-pay | Admitting: Neurology

## 2020-04-12 ENCOUNTER — Telehealth: Payer: Self-pay | Admitting: Family

## 2020-04-12 ENCOUNTER — Other Ambulatory Visit: Payer: Self-pay

## 2020-04-12 ENCOUNTER — Encounter: Payer: Self-pay | Admitting: Family

## 2020-04-12 ENCOUNTER — Ambulatory Visit (INDEPENDENT_AMBULATORY_CARE_PROVIDER_SITE_OTHER): Payer: 59 | Admitting: Family

## 2020-04-12 VITALS — BP 136/72 | HR 68 | Temp 98.0°F | Resp 16 | Ht 66.0 in | Wt 154.0 lb

## 2020-04-12 DIAGNOSIS — E348 Other specified endocrine disorders: Secondary | ICD-10-CM

## 2020-04-12 DIAGNOSIS — Z0000 Encounter for general adult medical examination without abnormal findings: Secondary | ICD-10-CM

## 2020-04-12 DIAGNOSIS — R635 Abnormal weight gain: Secondary | ICD-10-CM | POA: Diagnosis not present

## 2020-04-12 DIAGNOSIS — Z862 Personal history of diseases of the blood and blood-forming organs and certain disorders involving the immune mechanism: Secondary | ICD-10-CM | POA: Diagnosis not present

## 2020-04-12 DIAGNOSIS — E785 Hyperlipidemia, unspecified: Secondary | ICD-10-CM | POA: Diagnosis not present

## 2020-04-12 DIAGNOSIS — Z1211 Encounter for screening for malignant neoplasm of colon: Secondary | ICD-10-CM

## 2020-04-12 LAB — COMPREHENSIVE METABOLIC PANEL WITH GFR
ALT: 20 U/L (ref 0–35)
AST: 25 U/L (ref 0–37)
Albumin: 4.2 g/dL (ref 3.5–5.2)
Alkaline Phosphatase: 87 U/L (ref 39–117)
BUN: 10 mg/dL (ref 6–23)
CO2: 30 meq/L (ref 19–32)
Calcium: 9.5 mg/dL (ref 8.4–10.5)
Chloride: 103 meq/L (ref 96–112)
Creatinine, Ser: 0.7 mg/dL (ref 0.40–1.20)
GFR: 94.73 mL/min
Glucose, Bld: 99 mg/dL (ref 70–99)
Potassium: 4.1 meq/L (ref 3.5–5.1)
Sodium: 140 meq/L (ref 135–145)
Total Bilirubin: 0.6 mg/dL (ref 0.2–1.2)
Total Protein: 7.2 g/dL (ref 6.0–8.3)

## 2020-04-12 LAB — LIPID PANEL
Cholesterol: 176 mg/dL (ref 0–200)
HDL: 54.2 mg/dL (ref >39.00)
LDL Cholesterol: 109 mg/dL — ABNORMAL HIGH (ref 0–99)
NonHDL: 121.44
Total CHOL/HDL Ratio: 3
Triglycerides: 63 mg/dL (ref 0.0–149.0)
VLDL: 12.6 mg/dL (ref 0.0–40.0)

## 2020-04-12 LAB — CBC WITH DIFFERENTIAL/PLATELET
Basophils Absolute: 0 10*3/uL (ref 0.0–0.1)
Basophils Relative: 0.7 % (ref 0.0–3.0)
Eosinophils Absolute: 0 10*3/uL (ref 0.0–0.7)
Eosinophils Relative: 0.6 % (ref 0.0–5.0)
HCT: 40.4 % (ref 36.0–46.0)
Hemoglobin: 13.4 g/dL (ref 12.0–15.0)
Lymphocytes Relative: 22.6 % (ref 12.0–46.0)
Lymphs Abs: 1.6 10*3/uL (ref 0.7–4.0)
MCHC: 33.2 g/dL (ref 30.0–36.0)
MCV: 89.6 fl (ref 78.0–100.0)
Monocytes Absolute: 0.5 10*3/uL (ref 0.1–1.0)
Monocytes Relative: 7.1 % (ref 3.0–12.0)
Neutro Abs: 4.8 10*3/uL (ref 1.4–7.7)
Neutrophils Relative %: 69 % (ref 43.0–77.0)
Platelets: 240 10*3/uL (ref 150.0–400.0)
RBC: 4.51 Mil/uL (ref 3.87–5.11)
RDW: 14.1 % (ref 11.5–15.5)
WBC: 6.9 10*3/uL (ref 4.0–10.5)

## 2020-04-12 LAB — TSH: TSH: 1.24 u[IU]/mL (ref 0.35–4.50)

## 2020-04-12 NOTE — Telephone Encounter (Signed)
Please initiate cologuard for pt.

## 2020-04-12 NOTE — Patient Instructions (Addendum)
Please schedule mammogram and bone density on the first floor in imaging. Try to work on limiting portions and increasing walking to help prevent further weight loss.

## 2020-04-12 NOTE — Telephone Encounter (Signed)
Please update health maintenance   and lab section when results come back

## 2020-04-12 NOTE — Progress Notes (Signed)
Subjective:    Patient ID: Kari Hoffman, female    DOB: 1960/12/27, 59 y.o.   MRN: [id number]  HPI   Patient presents today for complete physical.  Immunizations: declines flu shot. Will get covid shot #1 today.  Diet:  Reports diet is healthy Exercise:  Not exercising regularly Wt Readings from Last 3 Encounters:  04/12/20 154 lb (69.9 kg)  02/12/19 139 lb (63 kg)  11/18/18 134 lb 3.2 oz (60.9 kg)  Colonoscopy:  Declines,  Dexa:  due Pap Smear: s/p hysterectomy Mammogram:  Due Dental- wear dentures Vision: due Wishes to defer shingrix vaccine for now.   She denies any current alcohol use.    Review of Systems  Constitutional: Negative for unexpected weight change.  HENT: Negative for hearing loss and rhinorrhea.   Eyes: Negative for visual disturbance.  Respiratory: Negative for cough.   Cardiovascular: Negative for leg swelling.  Gastrointestinal: Negative for blood in stool, constipation and diarrhea.  Genitourinary: Negative for difficulty urinating, dysuria and frequency.  Musculoskeletal: Negative for arthralgias and myalgias.  Skin: Negative for rash.  Neurological: Negative for headaches.  Hematological: Negative for adenopathy.  Psychiatric/Behavioral:       Denies depression   Past Medical History:  Diagnosis Date   Atrial flutter ([facility])    Depression    Ejection fraction    H/O alcohol abuse    Low back pain    Multiple thyroid nodules      Social History   Socioeconomic History   Marital status: Married    Spouse name: Not on file   Number of children: 2   Years of education: Not on file   Highest education level: Not on file  Occupational History   Occupation: [employer]  Tobacco Use   Smoking status: Former Smoker    Packs/day: 1.00    Years: 33.00    Pack years: 33.00    Quit date: 08/20/2017    Years since quitting: 2.6   Smokeless tobacco: Never Used   Tobacco comment: smoking since 1986  Vaping Use   Vaping Use:  Former  Substance and Sexual Activity   Alcohol use: Not Currently   Drug use: Never   Sexual activity: Not on file  Other Topics Concern   Not on file  Social History Narrative   Pt lives in 2 story home with her husband, daughter and son-in-law   Has 2 adult children   GED - does not remember how far in school prior to GED   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 [employer]   Social Determinants of Health   Financial Resource Strain: Not on file  Food Insecurity: Not on file  Transportation Needs: Not on file  Physical Activity: Not on file  Stress: Not on file  Social Connections: Not on file  Intimate Partner Violence: Not on file    Past Surgical History:  Procedure Laterality Date   ABDOMINAL HYSTERECTOMY  1989   left oophorectomy   ANTERIOR CERVICAL DISCECTOMY     C5- C6, C6- C7 anterior cervical diskectomy and fusion with allograft and anterir plating     scar tissue removal   2004    Family History  Problem Relation Age of Onset   Cancer Mother        bone   Leukemia Father    Diabetes Brother        type 2   Coronary artery disease Neg Hx  No Known Allergies  Current Outpatient Medications on File Prior to Visit  Medication Sig Dispense Refill   donepezil (ARICEPT) 10 MG tablet TAKE 1 TABLET BY MOUTH EVERYDAY AT BEDTIME 90 tablet 2   Multiple Vitamin (MULTIVITAMIN) capsule Take 1 capsule by mouth daily.     valACYclovir (VALTREX) 1000 MG tablet Take 1 tablet (1,000 mg total) by mouth 3 (three) times daily. 21 tablet 0   No current facility-administered medications on file prior to visit.    BP 136/72 (BP Location: Right Arm, Patient Position: Sitting, Cuff Size: Small)    Pulse 68    Temp 98 F (36.7 C) (Oral)    Resp 16    Ht 5\' 6"  (1.676 m)    Wt 154 lb (69.9 kg)    SpO2 100%    BMI 24.86 kg/m       Objective:   Physical Exam  Physical Exam  Constitutional: She is oriented to person, place, and time. She  appears well-developed and well-nourished. No distress.  HENT:  Head: Normocephalic and atraumatic.  Right Ear: Tympanic membrane and ear canal normal.  Left Ear: Tympanic membrane and ear canal normal.  Mouth/Throat: Not examined- pt wearing mask Eyes: Pupils are equal, round, and reactive to light. No scleral icterus.  Neck: Normal range of motion. No thyromegaly present.  Cardiovascular: Normal rate and regular rhythm.   No murmur heard. Pulmonary/Chest: Effort normal and breath sounds normal. No respiratory distress. He has no wheezes. She has no rales. She exhibits no tenderness.  Abdominal: Soft. Bowel sounds are normal. She exhibits no distension and no mass. There is no tenderness. There is no rebound and no guarding.  Musculoskeletal: She exhibits no edema.  Lymphadenopathy:    She has no cervical adenopathy.  Neurological: She is alert, she depends on her husband to answer most questions for her.  She has normal patellar reflexes. She exhibits normal muscle tone. Coordination normal.  Skin: Skin is warm and dry.  Psychiatric: She has a normal mood and affect. Her behavior is normal. Judgment and thought content normal.  Breasts/pelvic: deferred          Assessment & Plan:   Preventative care- refer for mammog. Declines colo but is agreeable to cologuard. Agreeable to dexa. Declines flu shot but is agreeable to covid vaccine which she will get at the pharmacy.    This visit occurred during the SARS-CoV-2 public health emergency.  Safety protocols were in place, including screening questions prior to the visit, additional usage of staff PPE, and extensive cleaning of exam room while observing appropriate contact time as indicated for disinfecting solutions.        Assessment & Plan:

## 2020-04-12 NOTE — Telephone Encounter (Signed)
Cologuard ordered

## 2020-04-13 NOTE — Progress Notes (Signed)
Mailed out to patient 

## 2020-04-23 LAB — COLOGUARD: Cologuard: NEGATIVE

## 2020-05-02 LAB — EXTERNAL GENERIC LAB PROCEDURE: COLOGUARD: NEGATIVE

## 2020-08-04 ENCOUNTER — Other Ambulatory Visit: Payer: Self-pay | Admitting: Neurology

## 2020-08-08 ENCOUNTER — Telehealth: Payer: Self-pay | Admitting: Neurology

## 2020-08-08 NOTE — Telephone Encounter (Signed)
Patient's husband called in to schedule an appointment after trying to get the patient's prescription filled. She needs a refill to get her to her next appointment on 04/13/21.

## 2020-08-09 ENCOUNTER — Other Ambulatory Visit: Payer: Self-pay

## 2020-08-09 MED ORDER — DONEPEZIL HCL 10 MG PO TABS: ORAL_TABLET | ORAL | 1 refills | Status: DC

## 2020-08-09 NOTE — Telephone Encounter (Signed)
Refill sent to pt pharmacy on file

## 2020-12-02 ENCOUNTER — Other Ambulatory Visit: Payer: Self-pay

## 2020-12-02 ENCOUNTER — Encounter (HOSPITAL_BASED_OUTPATIENT_CLINIC_OR_DEPARTMENT_OTHER): Payer: Self-pay

## 2020-12-02 ENCOUNTER — Emergency Department (HOSPITAL_BASED_OUTPATIENT_CLINIC_OR_DEPARTMENT_OTHER): Payer: 59

## 2020-12-02 ENCOUNTER — Emergency Department (HOSPITAL_BASED_OUTPATIENT_CLINIC_OR_DEPARTMENT_OTHER)
Admission: EM | Admit: 2020-12-02 | Discharge: 2020-12-02 | Disposition: A | Discharge status: Home / Self Care | Payer: 59 | Attending: Emergency Medicine | Admitting: Emergency Medicine

## 2020-12-02 DIAGNOSIS — S52502A Unspecified fracture of the lower end of left radius, initial encounter for closed fracture: Secondary | ICD-10-CM | POA: Diagnosis not present

## 2020-12-02 DIAGNOSIS — Z87891 Personal history of nicotine dependence: Secondary | ICD-10-CM | POA: Diagnosis not present

## 2020-12-02 DIAGNOSIS — Y92002 Bathroom of unspecified non-institutional (private) residence single-family (private) house as the place of occurrence of the external cause: Secondary | ICD-10-CM | POA: Diagnosis not present

## 2020-12-02 DIAGNOSIS — W010XXA Fall on same level from slipping, tripping and stumbling without subsequent striking against object, initial encounter: Secondary | ICD-10-CM | POA: Diagnosis not present

## 2020-12-02 DIAGNOSIS — F0390 Unspecified dementia without behavioral disturbance: Secondary | ICD-10-CM | POA: Diagnosis not present

## 2020-12-02 DIAGNOSIS — S6992XA Unspecified injury of left wrist, hand and finger(s), initial encounter: Secondary | ICD-10-CM | POA: Diagnosis present

## 2020-12-02 HISTORY — DX: Alcohol use, unspecified with alcohol-induced persisting amnestic disorder: F10.96

## 2020-12-02 HISTORY — DX: Unspecified dementia, unspecified severity, without behavioral disturbance, psychotic disturbance, mood disturbance, and anxiety: F03.90

## 2020-12-02 MED ORDER — HYDROCODONE-ACETAMINOPHEN 5-325 MG PO TABS: 1.0000 | ORAL_TABLET | Freq: Four times a day (QID) | ORAL | 0 refills | Status: DC | PRN

## 2020-12-02 MED ORDER — HYDROCODONE-ACETAMINOPHEN 5-325 MG PO TABS
1.0000 | ORAL_TABLET | Freq: Once | ORAL | Status: AC
Start: 2020-12-02 — End: 2020-12-02
  Administered 2020-12-02: 1 via ORAL
  Filled 2020-12-02: qty 1

## 2020-12-02 NOTE — ED Triage Notes (Signed)
Pt had a fall last PM in the bathroom. Pt states she tried to catch herself with her L arm. Pt with injury to L wrist with bruising and swelling. Pt denies head injury or LOC.

## 2020-12-02 NOTE — ED Notes (Signed)
Bruising, swelling, and possible deformity to left forearm/wrist.

## 2020-12-02 NOTE — Discharge Instructions (Addendum)
Take ibuprofen 3 times a day with meals for pain and swelling.  Do not take other anti-inflammatories at the same time (Advil, Motrin, naproxen, Aleve). You may supplement with Tylenol if you need further pain control. Use norco as needed for severe or breakthrough pain. Have caution, this may make you tired or groggy. Do not drive or operate heavy machinery while taking this medicine.  Use ice packs for pain and swelling.  Call the hand doctor listed below to set up a follow-up appointment. Keep the splint on until you follow-up with a hand doctor. Return to the emergency room if you develop severe worsening pain, numbness of your fingers, color change of your fingers, or any new, worsening, or concerning symptoms

## 2020-12-02 NOTE — ED Provider Notes (Signed)
[facility] EMERGENCY DEPARTMENT Provider Note   CSN: [id number] Arrival date & time: 12/02/20  1352     History Chief Complaint  Patient presents with   Kari Hoffman    Kari Hoffman is a 60 y.o. female presenting for evaluation of left wrist pain after a fall last night.  Patient states last night she slipped in the bathroom and fell, reaching out behind her with her left hand.  Since then, she has had gradually worsening pain, swelling, and bruising of her left wrist.  She has taken a total of 800 mg of ibuprofen today without significant improvement of symptoms.  No numbness or tingling of the fingers.  No injury elsewhere including elbow or shoulder.  She not hit her head or lose consciousness.  She is not on blood thinners.  He has been able to ambulate without difficulty, no back or pelvis pain.    HPI     Past Medical History:  Diagnosis Date   Atrial flutter (HCC)    Dementia (HCC)    Depression    Ejection fraction    H/O alcohol abuse    Low back pain    Multiple thyroid nodules    Wernicke-Korsakoff syndrome (alcoholic) (HCC)     Patient Active Problem List   Diagnosis Date Noted   Altered mental status 08/05/2017   Acute encephalopathy 08/05/2017   Routine general medical examination at a health care facility 12/13/2013   H/O alcohol abuse    Ejection fraction    Depression 03/16/2013   Weight gain 03/16/2013   Multiple thyroid nodules 03/16/2013   Low back pain 03/16/2013   Atrial flutter (HCC) 03/10/2013   Alcohol abuse 03/10/2013   Screening for hyperlipidemia 12/15/2012   TOBACCO ABUSE 02/08/2010    Past Surgical History:  Procedure Laterality Date   ABDOMINAL HYSTERECTOMY  1989   left oophorectomy   ANTERIOR CERVICAL DISCECTOMY     C5- C6, C6- C7 anterior cervical diskectomy and fusion with allograft and anterir plating     scar tissue removal   2004     OB History   No obstetric history on file.     Family History  Problem  Relation Age of Onset   Cancer Mother        bone   Leukemia Father    Diabetes Brother        type 2   Coronary artery disease Neg Hx     Social History   Tobacco Use   Smoking status: Former    Packs/day: 1.00    Years: 33.00    Pack years: 33.00    Types: Cigarettes    Quit date: 08/20/2017    Years since quitting: 3.2   Smokeless tobacco: Never   Tobacco comments:    smoking since 1986  Vaping Use   Vaping Use: Former  Substance Use Topics   Alcohol use: Not Currently    Comment: Last use 2018   Drug use: Never    Home Medications Prior to Admission medications   Medication Sig Start Date End Date Taking? Authorizing Provider  HYDROcodone-acetaminophen (NORCO/VICODIN) 5-325 MG tablet Take 1 tablet by mouth every 6 (six) hours as needed. 12/02/20  Yes Ahmir Bracken, PA-C  donepezil (ARICEPT) 10 MG tablet TAKE 1 TABLET BY MOUTH EVERYDAY AT BEDTIME 08/09/20   Van Clines, MD  Multiple Vitamin (MULTIVITAMIN) capsule Take 1 capsule by mouth daily.    [provider]  valACYclovir (VALTREX) 1000 MG tablet Take  1 tablet (1,000 mg total) by mouth 3 (three) times daily. 02/12/19   Sandford Craze, NP    Allergies    Patient has no known allergies.  Review of Systems   Review of Systems  Musculoskeletal:  Positive for arthralgias and joint swelling.  Neurological:  Negative for numbness.  Hematological:  Does not bruise/bleed easily.   Physical Exam Updated Vital Signs BP (!) 157/82 (BP Location: Right Arm)   Pulse 65   Temp 98.8 F (37.1 C)   Resp 20   Ht 5\' 6"  (1.676 m)   Wt 63.5 kg   SpO2 97%   BMI 22.60 kg/m   Physical Exam Vitals and nursing note reviewed.  Constitutional:      General: She is not in acute distress.    Appearance: She is well-developed.  HENT:     Head: Normocephalic and atraumatic.  Eyes:     Extraocular Movements: Extraocular movements intact.  Cardiovascular:     Rate and Rhythm: Normal rate.  Pulmonary:      Effort: Pulmonary effort is normal.  Abdominal:     General: There is no distension.  Musculoskeletal:        General: Normal range of motion.     Cervical back: Normal range of motion.     Comments: Diffuse bruising, swelling, and tenderness of the left wrist, mostly over the radial aspect.  No tenderness palpation of the proximal forearm.  Full active range of motion of the elbow and shoulder without difficulty.  Good distal sensation and cap refill of all fingers of the left hand.  No pain at the anatomic snuffbox.  There is mild tenderness palpation over the carpals and the second metacarpal of the left hand  Skin:    General: Skin is warm.     Capillary Refill: Capillary refill takes less than 2 seconds.     Findings: No rash.  Neurological:     Mental Status: She is alert and oriented to person, place, and time.    ED Results / Procedures / Treatments   Labs (all labs ordered are listed, but only abnormal results are displayed) Labs Reviewed - No data to display  EKG None  Radiology DG Wrist Complete Left  Result Date: 12/02/2020 CLINICAL DATA:  Fall on an outstretched arm. EXAM: LEFT WRIST - COMPLETE 3+ VIEW COMPARISON:  None. FINDINGS: There is an acute fracture of the distal radius. There is a mild impaction and no significant angulation. The fracture does not appear to extend to the radiocarpal joint. There is associated soft tissue swelling. IMPRESSION: Impacted acute fracture of the distal radius. Electronically Signed   By: 12/04/2020 M.D.   On: 12/02/2020 14:57   DG Hand Complete Left  Result Date: 12/02/2020 CLINICAL DATA:  Fall on an outstretched hand. EXAM: LEFT HAND - COMPLETE 3+ VIEW COMPARISON:  None. FINDINGS: There is an acute fracture of the distal radius. There is a mild impaction and no significant angulation. The fracture does not appear to extend to the radiocarpal joint. There is associated soft tissue swelling. IMPRESSION: Impacted acute fracture of the  distal radius. Electronically Signed   By: 12/04/2020 M.D.   On: 12/02/2020 14:58    Procedures Procedures   Medications Ordered in ED Medications  HYDROcodone-acetaminophen (NORCO/VICODIN) 5-325 MG per tablet 1 tablet (1 tablet Oral Given 12/02/20 1518)    ED Course  I have reviewed the triage vital signs and the nursing notes.  Pertinent labs & imaging results  that were available during my care of the patient were reviewed by me and considered in my medical decision making (see chart for details).    MDM Rules/Calculators/A&P                           Patient presenting for evaluation of left wrist pain, swelling, and bruising after fall yesterday.  On exam, patient appears nontoxic.  She is neurovascularly intact.  Xrays orderd.   X-rays viewed and independently interpreted by me, shows distal radius fracture.  Will place in volar splint and have patient follow-up.  Discussed findings with patient.  Discussed symptomatic management, elevation, ice, pain control.  At this time, patient appears safe for discharge.  Return precautions given.  Patient states she understands and agrees to plan  Final Clinical Impression(s) / ED Diagnoses Final diagnoses:  Closed fracture of distal end of left radius, unspecified fracture morphology, initial encounter    Rx / DC Orders ED Discharge Orders          Ordered    HYDROcodone-acetaminophen (NORCO/VICODIN) 5-325 MG tablet  Every 6 hours PRN        12/02/20 1540             Williamson, [place], PA-C 12/02/20 1644    Long, Arlyss Repress, MD 12/03/20 [phone]

## 2021-01-21 ENCOUNTER — Other Ambulatory Visit: Payer: Self-pay | Admitting: Neurology

## 2021-02-13 ENCOUNTER — Other Ambulatory Visit: Payer: Self-pay | Admitting: Neurology

## 2021-03-22 ENCOUNTER — Other Ambulatory Visit: Payer: Self-pay | Admitting: Neurology

## 2021-04-12 NOTE — Progress Notes (Signed)
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  Discussed the importance of regular daily schedule with inclusion of crossword puzzles to maintain brain function.  Continue to monitor mood by PCP Stay active at least 30 minutes at least 3 times a week.  Naps should be scheduled and should be no longer than 60 minutes and should not occur after 2 PM.  Mediterranean diet is recommended  Continue donepezil 10 mg daily Side effects were discussed.  Refills written  follow up in 1 year   Case discussed with Dr. Karel Jarvis who agrees with the plan     Subjective:    Kari Hoffman is a very pleasant 60 y.o. RH female with a history of alcohol abuse, chronic low back pain, depression, atrial flutter, seen today in follow up for memory loss. This patient is accompanied in the office by her husband who supplements the history.  Previous records as well as any outside records available were reviewed prior to todays visit.  Patient was last seen at our office on 04/08/18  at which time her MoCA was 14/30.  EEG was performed, with negative results for epilepsy.  Rest of the lab work including RPR, sed rate, CRP, ANA, antithyroglobulin antibodies, antithyroid peroxidase antibodies were normal.  Lumbar puncture was unremarkable. Neuropsych evaluation 07/2019 yielded a diagnosis of major neurocognitive disorder due to Wernicke s Korsakoff syndrome. Patient is currently on donepezil 10 mg daily tolerating well.    On todays visit, pt report her memory being "about the same if not better since discontinuing alcohol". Her husband reports that she may be repeating the same stories, or asked same questions, but not as often.  "Is only when she gets stuck on something".  The patient and husband deny any confusion when walking around from room to room, occasionally she loses the phone.  She no longer drives.   Her mood is stable, denies depression, or irritability.  She states that they have around her property, they walk at their [place] acres together on a daily basis.  She also likes to do puzzles in the computer.  They travel occasionally to the beach, or visit her daughter in [place].  She reports sleeping well, denies vivid dreams, REM behavior or sleepwalking. She denies any hallucinations or paranoia.  There are no hygiene concerns, she is independent of bathing and dressing, there is no hoarding behavior.  Her husband prepares the medications in a pillbox, and she takes and denies missing any doses.  Her husband is in charge of the finances.  Her appetite is good, has been stable for several years.  She denies any trouble swallowing.  She cooks and denies leaving the stove on accidentally.  She had 1 fall with left wrist fracture requiring cast from August through September, the fall was mechanical as she was trying to paint her toenails.  There was no loss of consciousness or seizure activity.  She denies any head injuries.  She denies any headaches, double vision, dizziness, focal numbness or tingling, unilateral weakness, or tremors.  She denies anosmia, and denies having had COVID.  She denies urine incontinence or retention.  She has mild issues with constipation, denies diarrhea.  She no longer drinks since 2019.    Initial visit 12/19/2019Thank you for your kind referral of Kari Hoffman for consultation of the above symptoms. Although her history is well known to you,  please allow me to reiterate it for the purpose of our medical record. The patient was accompanied to the clinic by her husband and daughter who also provide collateral information. Records and images were personally reviewed where available.  This is a 60 year old right-handed woman with a history of atrial flutter, alcohol abuse, presenting for evaluation of worsening memory. When asked about her memory, she states "I think it comes and  goes." her husband feels memory changes started after an incident last April 2019 when he came home from work and found her leaning up against the tub, unable to get up. He feels she was "pretty much normal before that." She does not recall being in the hospital for a week. Record from April 2019 hospitalization were reviewed, per notes, her husband started noticing loss of balance in February/March 2019. A few days prior to admission, he found her sitting on the floor of the living room and suspected she may have fallen. On 08/04/17, he found her leaning against the tub incontinent of stool and urine. He reported she had been sleeping and out of it for 1-2 weeks prior. She was found to have a UTI but concern for Wernicke's encephalopathy was also raised and she was treated with thiamine and folate. She has not been back to baseline since then, she would forget what they ate the prior meal, repeats herself, and every morning asking if she goes to work that day or if she still works. She moved to [facility] in 1996 but would ask why they moved to [facility] and what they are doing here. Her husband would see charges on their credit card that she does not remember, she would not recall going to the [employer] the day prior. She is independent with dressing and bathing. Her husband administers B complex daily, she is not on any prescription medications. She does bills with her husband, who states she does well with this. She is home most of the time and denies getting lost the rare times she drives close by. No paranoia or hallucinations, her daughter thinks she is less talkative but she states she is really not a talkative person. 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 She had been working there for a year until February 2018. Family did not think she was depressed afterwards.     She denies any headaches, dizziness, diplopia,  dysarthria/dysphagia, neck/back pain, focal numbness/tingling/weakness, bowel/bladder dysfunction, no recent falls. Sleep is okay, no daytime drowsiness. Her maternal grandparents had dementia. No history of significant head injuries. She has a 20+ year history of heavy alcohol use, per records she was drinking 1/2 to 1 case of beer daily in July 2019 ER visit (EtOH level 69), family reports she drank 6-8 beers a night until April admission, and since then she drinks a couple of 12 oz beers nightly. Her daughter recalls she had a couple of blackouts from alcohol abuse in the 71s while living in [place].   I personally reviewed MRI brain without contrast done 02/08/18 which did not show any acute changes, there was mild diffuse atrophy.  MRI of the brain from 02/07/2018 that shows fairly normal brain volume and morphology for age. There are no obvious areas of hyperintensity in the periaqueductal grey, thalami, fornices, or other areas often affected in Wernicke's encephalopathy. The mamillary bodies are not well visualized but do not appear obviously atrophic as  assessed by visual inspection of sagittal T1W and axial T2W series. There is a minimal burden of leukoaraiosis, not enough to cause and doubtful enough to contribute significantly to cognitive problems.          PREVIOUS MEDICATIONS:   CURRENT MEDICATIONS:  Outpatient Encounter Medications as of 04/13/2021  Medication Sig   Multiple Vitamin (MULTIVITAMIN) capsule Take 1 capsule by mouth daily.   [DISCONTINUED] donepezil (ARICEPT) 10 MG tablet TAKE 1 TABLET BY MOUTH EVERYDAY AT BEDTIME   donepezil (ARICEPT) 10 MG tablet 1 tab (10 mg )daily.   [DISCONTINUED] HYDROcodone-acetaminophen (NORCO/VICODIN) 5-325 MG tablet Take 1 tablet by mouth every 6 (six) hours as needed.   [DISCONTINUED] valACYclovir (VALTREX) 1000 MG tablet Take 1 tablet (1,000 mg total) by mouth 3 (three) times daily.   No facility-administered encounter medications on file as  of 04/13/2021.     Objective:     PHYSICAL EXAMINATION:    VITALS:   Vitals:   04/13/21 0919  BP: (!) 165/84  Pulse: 89  SpO2: 99%  Weight: 157 lb (71.2 kg)  Height:  (1.676 m)    GEN:  The patient appears stated age and is in NAD.  Flat affect HEENT:  Normocephalic, atraumatic.   Neurological examination:  General: NAD, well-groomed, appears stated age. Orientation: The patient is alert. Oriented to person, place and date Cranial nerves: There is good facial symmetry.The speech is fluent and clear. No aphasia or dysarthria. Fund of knowledge is appropriate. Recent memory is impaired, remote memory intact. Attention and concentration are normal, able to name objects and repeat phrases.  Hearing is intact to conversational tone.    Sensation: Sensation is intact to light touch throughout Motor: Strength is at least antigravity x4.  She has tenderness in her left wrist after fracture, but appears to be normal strength. Tremors: Very mild right greater than left intention tremor, no resting tremor DTR's 1/4 in UE/LE     Montreal Cognitive Assessment  04/08/2018  Visuospatial/ Executive (0/5) 3  Naming (0/3) 2  Attention: Read list of digits (0/2) 2  Attention: Read list of letters (0/1) 1  Attention: Serial 7 subtraction starting at 100 (0/3) 0  Language: Repeat phrase (0/2) 0  Language : Fluency (0/1) 0  Abstraction (0/2) 1  Delayed Recall (0/5) 0  Orientation (0/6) 4  Total 13  Adjusted Score (based on education) 14   MMSE - Mini Mental State Exam 04/13/2021 07/27/2019  Orientation to time 3 3  Orientation to Place 3 4  Registration 3 3  Attention/ Calculation 5 4  Recall 2 2  Language- name 2 objects 2 2  Language- repeat 1 1  Language- follow 3 step command 3 3  Language- read & follow direction 1 1  Write a sentence 1 1  Copy design 0 0  Total score 24 24    No flowsheet data found.     Movement examination: Tone: There is normal tone in the  UE/LE Abnormal movements: No myoclonus.  No asterixis.   Coordination:  There is no decremation with RAM's. Normal finger to nose  Gait and Station: The patient has no difficulty arising out of a deep-seated chair without the use of the hands. The patient's stride length is good.  Gait is cautious and narrow.        Total time spent on today's visit was 30  minutes, including both face-to-face time and nonface-to-face time. Time included that spent on review of records (prior notes available  to me/labs/imaging if pertinent), discussing treatment and goals, answering patient's questions and coordinating care.  Cc:  Sandford Craze, NP Marlowe Kays, PA-C

## 2021-04-13 ENCOUNTER — Ambulatory Visit (INDEPENDENT_AMBULATORY_CARE_PROVIDER_SITE_OTHER): Payer: 59 | Admitting: Neurology

## 2021-04-13 ENCOUNTER — Encounter: Payer: Self-pay | Admitting: Neurology

## 2021-04-13 ENCOUNTER — Other Ambulatory Visit: Payer: Self-pay

## 2021-04-13 VITALS — BP 165/84 | HR 89 | Ht 66.0 in | Wt 157.0 lb

## 2021-04-13 DIAGNOSIS — F0390 Unspecified dementia without behavioral disturbance: Secondary | ICD-10-CM

## 2021-04-13 MED ORDER — DONEPEZIL HCL 10 MG PO TABS: ORAL_TABLET | ORAL | 11 refills | Status: DC

## 2021-04-13 NOTE — Patient Instructions (Addendum)
It was a pleasure to see you today at our office.   Recommendations:  Follow up in 1 year Continue donepezil 10 mg daily.      RECOMMENDATIONS FOR ALL PATIENTS WITH MEMORY PROBLEMS: 1. Continue to exercise (Recommend 30 minutes of walking everyday, or 3 hours every week) 2. Increase social interactions - continue going to [place] and enjoy social gatherings with friends and family 3. Eat healthy, avoid fried foods and eat more fruits and vegetables 4. Maintain adequate blood pressure, blood sugar, and blood cholesterol level. Reducing the risk of stroke and cardiovascular disease also helps promoting better memory. 5. Avoid stressful situations. Live a simple life and avoid aggravations. Organize your time and prepare for the next day in anticipation. 6. Sleep well, avoid any interruptions of sleep and avoid any distractions in the bedroom that may interfere with adequate sleep quality 7. Avoid sugar, avoid sweets as there is a strong link between excessive sugar intake, diabetes, and cognitive impairment We discussed the Mediterranean diet, which has been shown to help patients reduce the risk of progressive memory disorders and reduces cardiovascular risk. This includes eating fish, eat fruits and green leafy vegetables, nuts like almonds and hazelnuts, walnuts, and also use olive oil. Avoid fast foods and fried foods as much as possible. Avoid sweets and sugar as sugar use has been linked to worsening of memory function.  There is always a concern of gradual progression of memory problems. If this is the case, then we may need to adjust level of care according to patient needs. Support, both to the patient and caregiver, should then be put into place.    FALL PRECAUTIONS: Be cautious when walking. Scan the area for obstacles that may increase the risk of trips and falls. When getting up in the mornings, sit up at the edge of the bed for a few minutes before getting out of bed. Consider  elevating the bed at the head end to avoid drop of blood pressure when getting up. Walk always in a well-lit room (use night lights in the walls). Avoid area rugs or power cords from appliances in the middle of the walkways. Use a walker or a cane if necessary and consider physical therapy for balance exercise. Get your eyesight checked regularly.  FINANCIAL OVERSIGHT: Supervision, especially oversight when making financial decisions or transactions is also recommended.  HOME SAFETY: Consider the safety of the kitchen when operating appliances like stoves, microwave oven, and blender. Consider having supervision and share cooking responsibilities until no longer able to participate in those. Accidents with firearms and other hazards in the house should be identified and addressed as well.   ABILITY TO BE LEFT ALONE: If patient is unable to contact 911 operator, consider using LifeLine, or when the need is there, arrange for someone to stay with patients. Smoking is a fire hazard, consider supervision or cessation. Risk of wandering should be assessed by caregiver and if detected at any point, supervision and safe proof recommendations should be instituted.  MEDICATION SUPERVISION: Inability to self-administer medication needs to be constantly addressed. Implement a mechanism to ensure safe administration of the medications.        Mediterranean Diet  Why follow it? Research shows Those who follow the Mediterranean diet have a reduced risk of heart disease  The diet is associated with a reduced incidence of Parkinson's and Alzheimer's diseases People following the diet may have longer life expectancies and lower rates of chronic diseases  The Dietary Guidelines for  Americans recommends the Mediterranean diet as an eating plan to promote health and prevent disease  What Is the Mediterranean Diet?  Healthy eating plan based on typical foods and recipes of Mediterranean-style cooking The diet is  primarily a plant based diet; these foods should make up a majority of meals   Starches - Plant based foods should make up a majority of meals - They are an important sources of vitamins, minerals, energy, antioxidants, and fiber - Choose whole grains, foods high in fiber and minimally processed items  - Typical grain sources include wheat, oats, barley, corn, brown rice, bulgar, farro, millet, polenta, couscous  - Various types of beans include chickpeas, lentils, fava beans, black beans, white beans   Fruits  Veggies - Large quantities of antioxidant rich fruits & veggies; 6 or more servings  - Vegetables can be eaten raw or lightly drizzled with oil and cooked  - Vegetables common to the traditional Mediterranean Diet include: artichokes, arugula, beets, broccoli, brussel sprouts, cabbage, carrots, celery, collard greens, cucumbers, eggplant, kale, leeks, lemons, lettuce, mushrooms, okra, onions, peas, peppers, potatoes, pumpkin, radishes, rutabaga, shallots, spinach, sweet potatoes, turnips, zucchini - Fruits common to the Mediterranean Diet include: apples, apricots, avocados, cherries, clementines, dates, figs, grapefruits, grapes, melons, nectarines, oranges, peaches, pears, pomegranates, strawberries, tangerines  Fats - Replace butter and margarine with healthy oils, such as olive oil, canola oil, and tahini  - Limit nuts to no more than a handful a day  - Nuts include walnuts, almonds, pecans, pistachios, pine nuts  - Limit or avoid candied, honey roasted or heavily salted nuts - Olives are central to the [employer] - can be eaten whole or used in a variety of dishes   Meats Protein - Limiting red meat: no more than a few times a month - When eating red meat: choose lean cuts and keep the portion to the size of deck of cards - Eggs: approx. 0 to 4 times a week  - Fish and lean poultry: at least 2 a week  - Healthy protein sources include, chicken, [place], lean beef, lamb -  Increase intake of seafood such as tuna, salmon, trout, mackerel, shrimp, scallops - Avoid or limit high fat processed meats such as sausage and bacon  Dairy - Include moderate amounts of low fat dairy products  - Focus on healthy dairy such as fat free yogurt, skim milk, low or reduced fat cheese - Limit dairy products higher in fat such as whole or 2% milk, cheese, ice cream  Alcohol - Moderate amounts of red wine is ok  - No more than 5 oz daily for women (all ages) and men older than age 85  - No more than 10 oz of wine daily for men younger than 98  Other - Limit sweets and other desserts  - Use herbs and spices instead of salt to flavor foods  - Herbs and spices common to the traditional Mediterranean Diet include: basil, bay leaves, chives, cloves, cumin, fennel, garlic, lavender, marjoram, mint, oregano, parsley, pepper, rosemary, sage, savory, sumac, tarragon, thyme   Its not just a diet, its a lifestyle:  The Mediterranean diet includes lifestyle factors typical of those in the region  Foods, drinks and meals are best eaten with others and savored Daily physical activity is important for overall good health This could be strenuous exercise like running and aerobics This could also be more leisurely activities such as walking, housework, yard-work, or taking the stairs Moderation is the key;  a balanced and healthy diet accommodates most foods and drinks Consider portion sizes and frequency of consumption of certain foods   Meal Ideas & Options:  Breakfast:  Whole wheat toast or whole wheat English muffins with peanut butter & hard boiled egg Steel cut oats topped with apples & cinnamon and skim milk  Fresh fruit: banana, strawberries, melon, berries, peaches  Smoothies: strawberries, bananas, greek yogurt, peanut butter Low fat greek yogurt with blueberries and granola  Egg white omelet with spinach and mushrooms Breakfast couscous: whole wheat couscous, apricots, skim milk,  cranberries  Sandwiches:  Hummus and grilled vegetables (peppers, zucchini, squash) on whole wheat bread   Grilled chicken on whole wheat pita with lettuce, tomatoes, cucumbers or tzatziki  [place] salad on whole wheat bread: tuna salad made with greek yogurt, olives, red peppers, capers, green onions Garlic rosemary lamb pita: lamb sauted with garlic, rosemary, salt & pepper; add lettuce, cucumber, greek yogurt to pita - flavor with lemon juice and black pepper  Seafood:  Mediterranean grilled salmon, seasoned with garlic, basil, parsley, lemon juice and black pepper Shrimp, lemon, and spinach whole-grain pasta salad made with low fat greek yogurt  Seared scallops with lemon orzo  Seared tuna steaks seasoned salt, pepper, coriander topped with tomato mixture of olives, tomatoes, olive oil, minced garlic, parsley, green onions and cappers  Meats:  Herbed greek chicken salad with kalamata olives, cucumber, feta  Red bell peppers stuffed with spinach, bulgur, lean ground beef (or lentils) & topped with feta   Kebabs: skewers of chicken, tomatoes, onions, zucchini, squash  [place] burgers: made with red onions, mint, dill, lemon juice, feta cheese topped with roasted red peppers Vegetarian Cucumber salad: cucumbers, artichoke hearts, celery, red onion, feta cheese, tossed in olive oil & lemon juice  Hummus and whole grain pita points with a greek salad (lettuce, tomato, feta, olives, cucumbers, red onion) Lentil soup with celery, carrots made with vegetable broth, garlic, salt and pepper  Tabouli salad: parsley, bulgur, mint, scallions, cucumbers, tomato, radishes, lemon juice, olive oil, salt and pepper.

## 2021-04-21 ENCOUNTER — Encounter: Payer: Self-pay | Admitting: Neurology

## 2021-04-21 NOTE — Progress Notes (Signed)
Patient was seen, evaluated, and treatment plan was discussed with the Advanced Practice Provider. Patient has flat affect. MMSE today 24/30 (24/30 in 07/2019). Symptoms have been overall stable, no behavioral changes. Continue Donepezil 10mg  daily. She has stopped alcohol use. I have also reviewed the orders written for this patient which were under my direction. I agree with the findings and the plan of care as documented by the Advanced Practice Provider.

## 2021-06-15 ENCOUNTER — Ambulatory Visit (INDEPENDENT_AMBULATORY_CARE_PROVIDER_SITE_OTHER): Payer: 59 | Admitting: Family

## 2021-06-15 ENCOUNTER — Encounter: Payer: Self-pay | Admitting: Family

## 2021-06-15 VITALS — BP 156/77 | HR 50 | Temp 98.6°F | Resp 16 | Wt 157.0 lb

## 2021-06-15 DIAGNOSIS — Z0001 Encounter for general adult medical examination with abnormal findings: Secondary | ICD-10-CM | POA: Diagnosis not present

## 2021-06-15 DIAGNOSIS — E785 Hyperlipidemia, unspecified: Secondary | ICD-10-CM

## 2021-06-15 DIAGNOSIS — Z23 Encounter for immunization: Secondary | ICD-10-CM

## 2021-06-15 DIAGNOSIS — F32A Depression, unspecified: Secondary | ICD-10-CM

## 2021-06-15 DIAGNOSIS — I10 Essential (primary) hypertension: Secondary | ICD-10-CM | POA: Diagnosis not present

## 2021-06-15 DIAGNOSIS — L84 Corns and callosities: Secondary | ICD-10-CM | POA: Insufficient documentation

## 2021-06-15 DIAGNOSIS — Z0000 Encounter for general adult medical examination without abnormal findings: Secondary | ICD-10-CM

## 2021-06-15 LAB — LIPID PANEL
Cholesterol: 175 mg/dL (ref <200)
HDL: 66 mg/dL (ref >=50)
LDL Cholesterol (Calc): 96 mg/dL (calc)
Non-HDL Cholesterol (Calc): 109 mg/dL (calc) (ref <130)
Total CHOL/HDL Ratio: 2.7 (calc) (ref <5.0)
Triglycerides: 44 mg/dL (ref <150)

## 2021-06-15 LAB — COMPREHENSIVE METABOLIC PANEL
AG Ratio: 1.6 (calc) (ref 1.0–2.5)
ALT: 15 U/L (ref 6–29)
AST: 24 U/L (ref 10–35)
Albumin: 4.3 g/dL (ref 3.6–5.1)
Alkaline phosphatase (APISO): 75 U/L (ref 37–153)
BUN: 10 mg/dL (ref 7–25)
CO2: 29 mmol/L (ref 20–32)
Calcium: 9.7 mg/dL (ref 8.6–10.4)
Chloride: 103 mmol/L (ref 98–110)
Creat: 0.71 mg/dL (ref 0.50–1.05)
Globulin: 2.7 g/dL (calc) (ref 1.9–3.7)
Glucose, Bld: 75 mg/dL (ref 65–99)
Potassium: 4.1 mmol/L (ref 3.5–5.3)
Sodium: 140 mmol/L (ref 135–146)
Total Bilirubin: 0.5 mg/dL (ref 0.2–1.2)
Total Protein: 7 g/dL (ref 6.1–8.1)

## 2021-06-15 MED ORDER — AMLODIPINE BESYLATE 5 MG PO TABS: 5.0000 mg | ORAL_TABLET | Freq: Every day | ORAL | 3 refills | Status: DC

## 2021-06-15 NOTE — Assessment & Plan Note (Signed)
Encouraged her to continue her work on [employer] and regular exercise. She is agreeable to mammogram. Cologuard up to date. Declines flu shot. Shingrix #1 today. Encouraged her to get covid bivalent booster at her pharmacy.

## 2021-06-15 NOTE — Progress Notes (Signed)
Subjective:     Patient ID: Kari Hoffman, female    DOB: 1961-02-10, 61 y.o.   MRN: [id number]  Chief Complaint  Patient presents with   Callouses    Complains of callous on bottom of right foot   Depression    Doing well    Depression        Associated symptoms include no myalgias and no headaches. Patient is in today for cpx.  Immunizations: Td 2020, shingrix, flu shot.  Had covid shot x 2. Declines flu shot. Will do  Diet:  reports good diet Exercise: some, needs more Colonoscopy: cologuard 04/23/20- negative Pap Smear: hysterectomy Mammogram: due  Vision: 11/22 Dental: edentulous  Memory Loss- following with Sharene Butters PA.  C/o area on the bottom of her foot     Health Maintenance Due  Topic Date Due   COVID-19 Vaccine (1) Never done   Hepatitis C Screening  Never done   Zoster Vaccines- Shingrix (1 of 2) Never done   MAMMOGRAM  02/22/2012   INFLUENZA VACCINE  Never done    Past Medical History:  Diagnosis Date   Atrial flutter (HCC)    Dementia (HCC)    Depression    Ejection fraction    H/O alcohol abuse    Low back pain    Multiple thyroid nodules    Wernicke-Korsakoff syndrome (alcoholic) (HCC)     Past Surgical History:  Procedure Laterality Date   ABDOMINAL HYSTERECTOMY  1989   left oophorectomy   ANTERIOR CERVICAL DISCECTOMY     C5- C6, C6- C7 anterior cervical diskectomy and fusion with allograft and anterir plating     scar tissue removal   2004    Family History  Problem Relation Age of Onset   Cancer Mother        bone   Leukemia Father    Diabetes Brother        type 2   Coronary artery disease Neg Hx     Social History   Socioeconomic History   Marital status: Married    Spouse name: Not on file   Number of children: 2   Years of education: Not on file   Highest education level: Not on file  Occupational History   Occupation: [employer]  Tobacco Use   Smoking status: Former    Packs/day: 1.00    Years: 33.00     Pack years: 33.00    Types: Cigarettes    Quit date: 08/20/2017    Years since quitting: 3.8   Smokeless tobacco: Never   Tobacco comments:    smoking since 1986  Vaping Use   Vaping Use: Former  Substance and Sexual Activity   Alcohol use: Not Currently    Comment: Last use 2018   Drug use: Never   Sexual activity: Not on file  Other Topics Concern   Not on file  Social History Narrative   Pt lives in 2 story home with her husband   Has 2 adult children   GED - does not remember how far in school prior to GED   Worked in Librarian, academic for a few grocery stores and [employer]   Right handed    Social Determinants of Health   Financial Resource Strain: Not on file  Food Insecurity: Not on file  Transportation Needs: Not on file  Physical Activity: Not on file  Stress: Not on file  Social Connections: Not on file  Intimate Partner Violence: Not on  file    Outpatient Medications Prior to Visit  Medication Sig Dispense Refill   donepezil (ARICEPT) 10 MG tablet 1 tab (10 mg )daily. 30 tablet 11   Multiple Vitamin (MULTIVITAMIN) capsule Take 1 capsule by mouth daily.     No facility-administered medications prior to visit.    No Known Allergies  Review of Systems  Constitutional:  Negative for weight loss.  HENT:  Negative for congestion and hearing loss.   Eyes:  Negative for blurred vision.  Respiratory:  Negative for cough.   Cardiovascular:  Negative for chest pain.  Gastrointestinal:  Negative for constipation and diarrhea.  Genitourinary:  Negative for dysuria and urgency.  Musculoskeletal:  Negative for joint pain and myalgias.  Skin:  Negative for rash.  Neurological:  Negative for headaches.  Psychiatric/Behavioral:  Negative for depression.       Objective:    Physical Exam Constitutional:      General: She is not in acute distress.    Appearance: Normal appearance. She is well-developed.  HENT:     Head: Normocephalic and atraumatic.     Right  Ear: Tympanic membrane, ear canal and external ear normal.     Left Ear: Tympanic membrane, ear canal and external ear normal.  Eyes:     General: No scleral icterus. Neck:     Thyroid: No thyromegaly.  Cardiovascular:     Rate and Rhythm: Normal rate and regular rhythm.     Heart sounds: Normal heart sounds. No murmur heard. Pulmonary:     Effort: Pulmonary effort is normal. No respiratory distress.     Breath sounds: Normal breath sounds. No wheezing.  Abdominal:     General: There is no distension.     Palpations: Abdomen is soft.     Tenderness: There is no abdominal tenderness.  Musculoskeletal:     Cervical back: Neck supple.  Skin:    General: Skin is warm and dry.     Comments: Corn noted at right foot plantar surface  Neurological:     Mental Status: She is alert. She is confused.     Comments: Continues repeat same questions  Psychiatric:        Mood and Affect: Mood normal.        Behavior: Behavior normal.        Thought Content: Thought content normal.        Judgment: Judgment normal.    BP (!) 156/77 (BP Location: Right Arm, Patient Position: Sitting, Cuff Size: Small)    Pulse (!) 50    Temp 98.6 F (37 C) (Oral)    Resp 16    Wt 157 lb (71.2 kg)    SpO2 99%    BMI 25.34 kg/m  Wt Readings from Last 3 Encounters:  06/15/21 157 lb (71.2 kg)  04/13/21 157 lb (71.2 kg)  12/02/20 140 lb (63.5 kg)       Assessment & Plan:   Problem List Items Addressed This Visit       Unprioritized   Hypertension    Uncontrolled. Will add amlodipine 5mg  once daily.       Relevant Medications   amLODipine (NORVASC) 5 MG tablet   Encounter for preventative adult health care exam with abnormal findings    Encouraged her to continue her work on healthy diet and regular exercise. She is agreeable to mammogram. Cologuard up to date. Declines flu shot. Shingrix #1 today. Encouraged her to get covid bivalent booster at her pharmacy.  Depression    Stable without  medication at this time.       Corn of foot    New. This is causing her discomfort with walking. After consent was obtained, corn was easily debrided with dermal curette. No bleeding or discomfort noted. Discussed use of clean pumice to gently debride dry skin on feet as needed as well as otc corn pads if she develops recurrence of corn. Patient and husband verbalize understanding.       Other Visit Diagnoses     Preventative health care    -  Primary   Relevant Orders   MM 3D SCREEN BREAST BILATERAL   Hyperlipidemia, unspecified hyperlipidemia type       Relevant Medications   amLODipine (NORVASC) 5 MG tablet   Other Relevant Orders   Comp Met (CMET)   Lipid panel       I am having Brion Aliment start on amLODipine. I am also having her maintain her multivitamin and donepezil.  Meds ordered this encounter  Medications   amLODipine (NORVASC) 5 MG tablet    Sig: Take 1 tablet (5 mg total) by mouth daily.    Dispense:  30 tablet    Refill:  3    Order Specific Question:   Supervising Provider    Answer:   Penni Homans A [[id number]]   BP Readings from Last 3 Encounters:  06/15/21 (!) 156/77  04/13/21 (!) 165/84  12/02/20 (!) 157/82

## 2021-06-15 NOTE — Addendum Note (Signed)
Addended by: Wilford Corner on: 06/15/2021 04:29 PM   Modules accepted: Orders

## 2021-06-15 NOTE — Assessment & Plan Note (Signed)
Uncontrolled.  Will add amlodipine 5mg once daily 

## 2021-06-15 NOTE — Assessment & Plan Note (Signed)
New. This is causing her discomfort with walking. After consent was obtained, corn was easily debrided with dermal curette. No bleeding or discomfort noted. Discussed use of clean pumice to gently debride dry skin on feet as needed as well as otc corn pads if she develops recurrence of corn. Patient and husband verbalize understanding.

## 2021-06-15 NOTE — Assessment & Plan Note (Signed)
Stable without medication at this time.  

## 2021-06-17 ENCOUNTER — Encounter: Payer: Self-pay | Admitting: Family

## 2021-06-19 NOTE — Progress Notes (Signed)
Mailed out to pt 

## 2021-06-26 ENCOUNTER — Other Ambulatory Visit: Payer: Self-pay

## 2021-06-26 ENCOUNTER — Encounter (HOSPITAL_BASED_OUTPATIENT_CLINIC_OR_DEPARTMENT_OTHER): Payer: Self-pay

## 2021-06-26 ENCOUNTER — Ambulatory Visit (HOSPITAL_BASED_OUTPATIENT_CLINIC_OR_DEPARTMENT_OTHER)
Admission: RE | Admit: 2021-06-26 | Discharge: 2021-06-26 | Disposition: A | Discharge status: Home / Self Care | Payer: 59 | Source: Ambulatory Visit | Attending: Family | Admitting: Family

## 2021-06-26 DIAGNOSIS — Z1231 Encounter for screening mammogram for malignant neoplasm of breast: Secondary | ICD-10-CM | POA: Diagnosis not present

## 2021-06-26 DIAGNOSIS — Z0000 Encounter for general adult medical examination without abnormal findings: Secondary | ICD-10-CM

## 2021-06-29 ENCOUNTER — Encounter: Payer: Self-pay | Admitting: Family Medicine

## 2021-06-29 ENCOUNTER — Ambulatory Visit (INDEPENDENT_AMBULATORY_CARE_PROVIDER_SITE_OTHER): Payer: 59 | Admitting: Family Medicine

## 2021-06-29 VITALS — BP 120/71 | HR 72 | Temp 97.7°F | Ht 66.0 in | Wt 156.2 lb

## 2021-06-29 DIAGNOSIS — R040 Epistaxis: Secondary | ICD-10-CM | POA: Diagnosis not present

## 2021-06-29 NOTE — Patient Instructions (Addendum)
Avoid digital trauma (picking your nose). ? ?Use an air humidifier at night at least.  ? ?Use triple antibiotic ointment like Neosporin twice daily. Use Vaseline in between. ? ?Try to drink 55-60 oz of water daily outside of exercise. ? ?Take Metamucil or Benefiber daily if you are staying hydrated.  ? ?Let us know if you need anything. ?

## 2021-06-29 NOTE — Progress Notes (Signed)
Chief Complaint  ?Patient presents with  ? Epistaxis  ?  After eating ?  ? ? ?Subjective: ?Patient is a 61 y.o. female here for nosebleeds. Here w spouse.  ? ?Last winter had issues with nose bleeds. Was placed on amlodipine on 2/24 and noticed worsening. It is affected on both sides but worse on the L. She does not take any anticoagulation or antiplatelets. Lasts 2-3 minutes. Does not pick her nose. No other areas of easy bruising, bleeding.  ? ?Past Medical History:  ?Diagnosis Date  ? Atrial flutter (HCC)   ? Dementia (HCC)   ? Depression   ? Ejection fraction   ? H/O alcohol abuse   ? Low back pain   ? Multiple thyroid nodules   ? Wernicke-Korsakoff syndrome (alcoholic) (HCC)   ? ? ?Objective: ?BP 120/71   Pulse 72   Temp 97.7 ?F (36.5 ?C) (Oral)   Ht 5\' 6"  (1.676 m)   Wt 156 lb 4 oz (70.9 kg)   SpO2 99%   BMI 25.22 kg/m?  ?General: Awake, appears stated age ?HEENT: MMM, no pharyngeal bleeding, nares are patent, healing excoriated lesion over the left septum, varicosities noted on the right without active bleeding ?Lungs: No accessory muscle use ?Psych: Age appropriate judgment and insight, normal affect and mood ? ?Assessment and Plan: ?Epistaxis ? ?Avoid trauma, use an air humidifier, triple antibiotic ointment twice daily, Vaseline as needed, no active bleeding.  Consider referral to ENT if no improvement. ?The patient and her spouse voiced understanding and agreement to the plan. ? ? , DO ?06/29/21  ?2:39 PM ? ? ? ? ?

## 2021-07-13 ENCOUNTER — Ambulatory Visit (INDEPENDENT_AMBULATORY_CARE_PROVIDER_SITE_OTHER): Payer: 59 | Admitting: Family

## 2021-07-13 DIAGNOSIS — L84 Corns and callosities: Secondary | ICD-10-CM

## 2021-07-13 DIAGNOSIS — I10 Essential (primary) hypertension: Secondary | ICD-10-CM | POA: Diagnosis not present

## 2021-07-13 NOTE — Assessment & Plan Note (Signed)
Stable.  Monitor.  

## 2021-07-13 NOTE — Progress Notes (Signed)
? ?Subjective:  ? ?By signing my name below, I, Cassell Clement, attest that this documentation has been prepared under the direction and in the presence of Sandford Craze NP, 07/13/2021   ? ? Patient ID: Kari Hoffman, female    DOB: 1961-02-15, 61 y.o.   MRN: [id number] ? ?Chief Complaint  ?Patient presents with  ? Hypertension  ?  Here for follow up  ? ? ?HPI ?Patient is in today for an office visit. She is with her partner.  ? ?Blood Pressure - Her blood pressure is doing well. She discontinued use of 5 MG of Amlodipine about a few weeks ago because she noticed frequent nosebleeds while taking it. She denies taking any variation of Aspirin. She has been measuring her blood pressure and she reports that her systolic ranges around the 120's.  ?BP Readings from Last 3 Encounters:  ?07/13/21 122/62  ?06/29/21 120/71  ?06/15/21 (!) 156/77  ? ?Callosus - Her callosus under her right foot is stable.  ? ?Health Maintenance Due  ?Topic Date Due  ? COVID-19 Vaccine (1) Never done  ? Hepatitis C Screening  Never done  ? INFLUENZA VACCINE  Never done  ? ? ?Past Medical History:  ?Diagnosis Date  ? Atrial flutter (HCC)   ? Dementia (HCC)   ? Depression   ? Ejection fraction   ? H/O alcohol abuse   ? Low back pain   ? Multiple thyroid nodules   ? Wernicke-Korsakoff syndrome (alcoholic) (HCC)   ? ? ?Past Surgical History:  ?Procedure Laterality Date  ? ABDOMINAL HYSTERECTOMY  1989  ? left oophorectomy  ? ANTERIOR CERVICAL DISCECTOMY    ? C5- C6, C6- C7 anterior cervical diskectomy and fusion with allograft and anterir plating    ? scar tissue removal   2004  ? ? ?Family History  ?Problem Relation Age of Onset  ? Cancer Mother   ?     bone  ? Leukemia Father   ? Diabetes Brother   ?     type 2  ? Coronary artery disease Neg Hx   ? ? ?Social History  ? ?Socioeconomic History  ? Marital status: Married  ?  Spouse name: Not on file  ? Number of children: 2  ? Years of education: Not on file  ? Highest education level: Not on file   ?Occupational History  ? Occupation: Actor  ?Tobacco Use  ? Smoking status: Former  ?  Packs/day: 1.00  ?  Years: 33.00  ?  Pack years: 33.00  ?  Types: Cigarettes  ?  Quit date: 08/20/2017  ?  Years since quitting: 3.8  ? Smokeless tobacco: Never  ? Tobacco comments:  ?  smoking since 1986  ?Vaping Use  ? Vaping Use: Former  ?Substance and Sexual Activity  ? Alcohol use: Not Currently  ?  Comment: Last use 2018  ? Drug use: Never  ? Sexual activity: Not on file  ?Other Topics Concern  ? Not on file  ?Social History Narrative  ? Pt lives in 2 story home with her husband  ? Has 2 adult children  ? GED - does not remember how far in school prior to GED  ? 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 Right handed   ? ?Social Determinants of Health  ? ?Financial Resource Strain: Not on file  ?Food Insecurity: Not on file  ?Transportation Needs: Not on file  ?Physical Activity: Not on  file  ?Stress: Not on file  ?Social Connections: Not on file  ?Intimate Partner Violence: Not on file  ? ? ?Outpatient Medications Prior to Visit  ?Medication Sig Dispense Refill  ? donepezil (ARICEPT) 10 MG tablet 1 tab (10 mg )daily. 30 tablet 11  ? Multiple Vitamin (MULTIVITAMIN) capsule Take 1 capsule by mouth daily.    ? amLODipine (NORVASC) 5 MG tablet Take 1 tablet (5 mg total) by mouth daily. 30 tablet 3  ? ?No facility-administered medications prior to visit.  ? ? ?No Known Allergies ? ?ROS ? ?See HPI ? ?   ?Objective:  ?  ?Physical Exam ?Constitutional:   ?   General: She is not in acute distress. ?   Appearance: Normal appearance. She is not ill-appearing.  ?HENT:  ?   Head: Normocephalic and atraumatic.  ?   Right Ear: External ear normal.  ?   Left Ear: External ear normal.  ?Eyes:  ?   Extraocular Movements: Extraocular movements intact.  ?   Pupils: Pupils are equal, round, and reactive to light.  ?Cardiovascular:  ?   Rate and Rhythm: Normal rate and regular rhythm.  ?   Heart sounds: Normal  heart sounds. No murmur heard. ?  No gallop.  ?Pulmonary:  ?   Effort: Pulmonary effort is normal. No respiratory distress.  ?   Breath sounds: Normal breath sounds. No wheezing or rales.  ?Skin: ?   General: Skin is warm and dry.  ?   Comments: Callous noted on ball of foot no longer protruding like last visit.   ?Neurological:  ?   Mental Status: She is alert and oriented to person, place, and time.  ?Psychiatric:     ?   Mood and Affect: Mood normal.     ?   Behavior: Behavior normal.     ?   Judgment: Judgment normal.  ? ? ?BP 122/62 (BP Location: Right Arm, Patient Position: Sitting, Cuff Size: Small)   Pulse 69   Temp 98.4 ?F (36.9 ?C) (Oral)   Resp 16   Wt 162 lb (73.5 kg)   SpO2 98%   BMI 26.15 kg/m?  ?Wt Readings from Last 3 Encounters:  ?07/13/21 162 lb (73.5 kg)  ?06/29/21 156 lb 4 oz (70.9 kg)  ?06/15/21 157 lb (71.2 kg)  ? ? ?   ?Assessment & Plan:  ? ?Problem List Items Addressed This Visit   ? ?  ? Unprioritized  ? Hypertension  ?  BP Readings from Last 3 Encounters:  ?07/13/21 122/62  ?06/29/21 120/71  ?06/15/21 (!) 156/77  ?BP is stable and improved despite not taking amlodipine. I advised her OK to remain off of amlodipine. I doubt amlodipine was the cause of her episode of epistaxis. Husband will continue to monitor her blood pressure at home and let us know if she develops elevated reading.  ?  ?  ? Corn of foot  ?  Stable. Monitor.  ?  ?  ? ? ? ? ?No orders of the defined types were placed in this encounter. ? ? ?I, Lemont FillersMelissa S O'Sullivan, NP, personally preformed the services described in this documentation.  All medical record entries made by the scribe were at my direction and in my presence.  I have reviewed the chart and discharge instructions (if applicable) and agree that the record reflects my personal performance and is accurate and complete. 07/13/2021 ? ? ?I,Amber Collins,acting as a Neurosurgeonscribe for [employer] & CoMelissa S O'Sullivan, NP.,have documented all relevant documentation  on the behalf of  Lemont Fillers, NP,as directed by  Lemont Fillers, NP while in the presence of Lemont Fillers, NP. ? ? ? ?Lemont Fillers, NP ? ?

## 2021-07-13 NOTE — Assessment & Plan Note (Addendum)
BP Readings from Last 3 Encounters:  ?07/13/21 122/62  ?06/29/21 120/71  ?06/15/21 (!) 156/77  ? ?BP is stable and improved despite not taking amlodipine. I advised her OK to remain off of amlodipine. I doubt amlodipine was the cause of her episode of epistaxis. Husband will continue to monitor her blood pressure at home and let us know if she develops elevated reading.  ?

## 2021-09-10 ENCOUNTER — Other Ambulatory Visit: Payer: Self-pay | Admitting: Family

## 2021-10-09 ENCOUNTER — Ambulatory Visit: Payer: 59 | Admitting: Family

## 2021-10-10 ENCOUNTER — Ambulatory Visit (INDEPENDENT_AMBULATORY_CARE_PROVIDER_SITE_OTHER): Payer: 59 | Admitting: Family

## 2021-10-10 VITALS — BP 159/73 | HR 67 | Temp 98.4°F | Resp 16 | Wt 167.0 lb

## 2021-10-10 DIAGNOSIS — I10 Essential (primary) hypertension: Secondary | ICD-10-CM

## 2021-10-10 DIAGNOSIS — L84 Corns and callosities: Secondary | ICD-10-CM | POA: Diagnosis not present

## 2021-10-10 DIAGNOSIS — K5900 Constipation, unspecified: Secondary | ICD-10-CM | POA: Diagnosis not present

## 2021-10-10 NOTE — Progress Notes (Signed)
Subjective:   By signing my name below, I, Cassell Clement, attest that this documentation has been prepared under the direction and in the presence of Birdie Sons, NP 10/10/2021     Patient ID: Kari Hoffman, female    DOB: [birth year], 61 y.o.   MRN: [id number]  Chief Complaint  Patient presents with   Memory Loss    Here for follow up   Hypertension    Here for follow     HPI Patient is in today for an office visit.  Abnormal Stools: She complains of abnormal stools. She states that when she produces bowel movements, it's not a consistent strand. She states that she eats a variety of everything.  Corn: The corn on her right foot remains persistent. She states that the area causes her pain. Blood Pressure: As of today's visit, her blood pressure is elevating. She states that her blood pressure is normal until she goes to the doctor's office.  BP Readings from Last 3 Encounters:  10/10/21 (!) 159/73  07/13/21 122/62  06/29/21 120/71   Pulse Readings from Last 3 Encounters:  10/10/21 67  07/13/21 69  06/29/21 72    Health Maintenance Due  Topic Date Due   COVID-19 Vaccine (1) Never done   Hepatitis C Screening  Never done   Zoster Vaccines- Shingrix (2 of 2) 08/10/2021    Past Medical History:  Diagnosis Date   Atrial flutter (HCC)    Dementia (HCC)    Depression    Ejection fraction    H/O alcohol abuse    Low back pain    Multiple thyroid nodules    Wernicke-Korsakoff syndrome (alcoholic) (HCC)     Past Surgical History:  Procedure Laterality Date   ABDOMINAL HYSTERECTOMY  1989   left oophorectomy   ANTERIOR CERVICAL DISCECTOMY     C5- C6, C6- C7 anterior cervical diskectomy and fusion with allograft and anterir plating     scar tissue removal   2004    Family History  Problem Relation Age of Onset   Cancer Mother        bone   Leukemia Father    Diabetes Brother        type 2   Coronary artery disease Neg Hx     Social History    Socioeconomic History   Marital status: Married    Spouse name: Not on file   Number of children: 2   Years of education: Not on file   Highest education level: Not on file  Occupational History   Occupation: [employer]  Tobacco Use   Smoking status: Former    Packs/day: 1.00    Years: 33.00    Total pack years: 33.00    Types: Cigarettes    Quit date: 08/20/2017    Years since quitting: 4.1   Smokeless tobacco: Never   Tobacco comments:    smoking since 1986  Vaping Use   Vaping Use: Former  Substance and Sexual Activity   Alcohol use: Not Currently    Comment: Last use 2018   Drug use: Never   Sexual activity: Not on file  Other Topics Concern   Not on file  Social History Narrative   Pt lives in 2 story home with her husband   Has 2 adult children   GED - does not remember how far in school prior to GED   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 30 tablet 11   Multiple Vitamin (MULTIVITAMIN) capsule Take 1 capsule by mouth daily.     No facility-administered medications prior to visit.    No Known Allergies  Review of Systems  Gastrointestinal:        (+) Abnormal Stools  Skin:        (+) Corn Under Right Foot       Objective:    Physical Exam Constitutional:      General: She is not in acute distress.    Appearance: Normal appearance. She is not ill-appearing.  HENT:     Head: Normocephalic and atraumatic.     Right Ear: External ear normal.     Left Ear: External ear normal.  Eyes:     Extraocular Movements:  Extraocular movements intact.     Pupils: Pupils are equal, round, and reactive to light.  Skin:    General: Skin is warm and dry.     Comments: Corn Right Foot   Neurological:     Mental Status: She is alert and oriented to person, place, and time.  Psychiatric:        Mood and Affect: Mood normal.        Behavior: Behavior normal.        Judgment: Judgment normal.     BP (!) 159/73 (BP Location: Right Arm, Patient Position: Sitting, Cuff Size: Small)   Pulse 67   Temp 98.4 F (36.9 C) (Oral)   Resp 16   Wt 167 lb (75.8 kg)   BMI 26.95 kg/m  Wt Readings from Last 3 Encounters:  10/10/21 167 lb (75.8 kg)  07/13/21 162 lb (73.5 kg)  06/29/21 156 lb 4 oz (70.9 kg)       Assessment & Plan:   Problem List Items Addressed This Visit       Unprioritized   Hypertension    She reports that at home bp is always normal. BP is high here today. She is not wanting to restart amlodipine at this time. Advised the patient/husband to check bp once daily for 3 days and then contact me with these readings for further evaluation.       Corn of foot - Primary    Uncontrolled. Refer to podiatry.       Relevant Orders   Ambulatory referral to Podiatry   Constipation    New. Recommended the following:   For constipation drink 8 glasses of water a day.   Increase fresh fruits/veggies in your diet. Add a metamucil fiber supplement once daily.         No orders of the defined types were placed in this encounter.   I, Lemont Fillers, NP, personally preformed the services described in this documentation.  All medical record entries made by the scribe were at my direction and in my presence.  I have reviewed the chart and discharge instructions (if applicable) and agree that the record reflects my personal performance and is accurate and complete. 10/10/2021  I,Amber Collins,acting as a Neurosurgeon for [employer]  Inda Castle, NP.,have documented all relevant documentation on the  behalf of Nance Pear, NP,as directed by  Nance Pear, NP while in the presence of Nance Pear, NP.  Nance Pear, NP

## 2021-10-10 NOTE — Patient Instructions (Signed)
Please check your blood pressure once daily at home for 3 days and then call us with your readings. For constipation drink 8 glasses of water a day.   Increase fresh fruits/veggies in your diet. Add a metamucil fiber supplement once daily.

## 2021-10-10 NOTE — Assessment & Plan Note (Signed)
She reports that at home bp is always normal. BP is high here today. She is not wanting to restart amlodipine at this time. Advised the patient/husband to check bp once daily for 3 days and then contact me with these readings for further evaluation.

## 2021-10-10 NOTE — Assessment & Plan Note (Signed)
Uncontrolled. Refer to podiatry.

## 2021-10-10 NOTE — Assessment & Plan Note (Signed)
New. Recommended the following:   For constipation drink 8 glasses of water a day.   Increase fresh fruits/veggies in your diet. Add a metamucil fiber supplement once daily.

## 2021-10-19 ENCOUNTER — Telehealth: Payer: Self-pay | Admitting: Family

## 2021-10-19 NOTE — Telephone Encounter (Signed)
Duffy Rhody (spouse) called stating that [name] instructed him to take pt BP readings and relay them to her when possible. Readings are as follows:  120/64 on 6.25.23 119/55 on 6.24.23 120/60 on 6.23.23 124/62 on 6.22.23

## 2021-10-19 NOTE — Telephone Encounter (Signed)
Left detailed message with this information.

## 2021-10-19 NOTE — Telephone Encounter (Signed)
BP Readings from Last 3 Encounters:  10/10/21 (!) 159/73  07/13/21 122/62  06/29/21 120/71   Please notify husband that BP at home looks much better than the reading in the office. No medication changes at this time. Thank you for sharing.

## 2021-11-26 ENCOUNTER — Ambulatory Visit (INDEPENDENT_AMBULATORY_CARE_PROVIDER_SITE_OTHER): Payer: 59 | Admitting: Podiatry

## 2021-11-26 DIAGNOSIS — L989 Disorder of the skin and subcutaneous tissue, unspecified: Secondary | ICD-10-CM

## 2021-11-26 NOTE — Progress Notes (Signed)
   Chief Complaint  Patient presents with   Foot Problem    right foot corn, ball of right foot, tender when walking, corn pads not working     Subjective: 61 y.o. female presenting to the office today as a new patient for evaluation of symptomatic calluses to the plantar aspect of the left forefoot that is been present for about 18 months now.  Idiopathic onset.  She has not done anything for treatment other than trimming them herself.  She presents for further treatment and evaluation   Past Medical History:  Diagnosis Date   Atrial flutter (HCC)    Dementia (HCC)    Depression    Ejection fraction    H/O alcohol abuse    Low back pain    Multiple thyroid nodules    Wernicke-Korsakoff syndrome (alcoholic) (HCC)     Past Surgical History:  Procedure Laterality Date   ABDOMINAL HYSTERECTOMY  1989   left oophorectomy   ANTERIOR CERVICAL DISCECTOMY     C5- C6, C6- C7 anterior cervical diskectomy and fusion with allograft and anterir plating     scar tissue removal   2004    No Known Allergies   Objective:  Physical Exam General: Alert and oriented x3 in no acute distress  Dermatology: Hyperkeratotic lesion(s) present on the plantar aspect of the left forefoot x2. Pain on palpation with a central nucleated core noted. Skin is warm, dry and supple bilateral lower extremities. Negative for open lesions or macerations.  Vascular: Palpable pedal pulses bilaterally. No edema or erythema noted. Capillary refill within normal limits.  Neurological: Epicritic and protective threshold grossly intact bilaterally.   Musculoskeletal Exam: Pain on palpation at the keratotic lesion(s) noted. Range of motion within normal limits bilateral. Muscle strength 5/5 in all groups bilateral.  Assessment: 1.  Symptomatic callus; benign skin lesion/porokeratosis   Plan of Care:  1. Patient evaluated 2. Excisional debridement of keratoic lesion(s) using a chisel blade was performed without  incident.  3.  Salicylic acid applied.  Recommend OTC salicylic acid daily x2 weeks 4. Patient is to return to the clinic PRN.   Felecia Shelling, DPM [facility]  Dr. Felecia Shelling, DPM    [address] [place], [place] [address]                Office [phone]  Fax [phone]

## 2022-01-11 ENCOUNTER — Ambulatory Visit (INDEPENDENT_AMBULATORY_CARE_PROVIDER_SITE_OTHER): Payer: 59 | Admitting: Family

## 2022-01-11 VITALS — BP 115/69 | HR 81 | Temp 98.3°F | Resp 16 | Wt 173.0 lb

## 2022-01-11 DIAGNOSIS — L84 Corns and callosities: Secondary | ICD-10-CM

## 2022-01-11 DIAGNOSIS — R011 Cardiac murmur, unspecified: Secondary | ICD-10-CM

## 2022-01-11 DIAGNOSIS — Z8679 Personal history of other diseases of the circulatory system: Secondary | ICD-10-CM

## 2022-01-11 DIAGNOSIS — F32A Depression, unspecified: Secondary | ICD-10-CM

## 2022-01-11 DIAGNOSIS — Z23 Encounter for immunization: Secondary | ICD-10-CM | POA: Diagnosis not present

## 2022-01-11 DIAGNOSIS — K5900 Constipation, unspecified: Secondary | ICD-10-CM | POA: Diagnosis not present

## 2022-01-11 DIAGNOSIS — F0390 Unspecified dementia without behavioral disturbance: Secondary | ICD-10-CM

## 2022-01-11 DIAGNOSIS — I10 Essential (primary) hypertension: Secondary | ICD-10-CM | POA: Diagnosis not present

## 2022-01-11 NOTE — Assessment & Plan Note (Signed)
Reports mood is good.

## 2022-01-11 NOTE — Assessment & Plan Note (Signed)
BP Readings from Last 3 Encounters:  01/11/22 115/69  10/10/21 (!) 159/73  07/13/21 122/62   BP looks great.  Not on medication. Monitor.

## 2022-01-11 NOTE — Progress Notes (Signed)
Subjective:   By signing my name below, I, Carylon Perches, attest that this documentation has been prepared under the direction and in the presence of [place], NP 01/11/2022   Patient ID: Kari Hoffman, female    DOB: [birth year], 61 y.o.   MRN: [id number]  No chief complaint on file.   HPI Patient is in today for an office visit. She is accompanied by her partner.   Immunizations: She is interested in receiving an influenza vaccine during today's visit.  Corn on Foot: Her corn on her foot is resolved but the bone is present. She went to a podiatrist for her symptoms Constipation: She is currently taking an OTC dietary supplement and symptoms are resolved.  Mood: She reports that her mood is good.  Social: She is not smoking or drinking alcohol Cardiologist: She is not following up with a cardiologist.   Health Maintenance Due  Topic Date Due   COVID-19 Vaccine (1) Never done   Hepatitis C Screening  Never done   Zoster Vaccines- Shingrix (2 of 2) 08/10/2021   INFLUENZA VACCINE  Never done    Past Medical History:  Diagnosis Date   Atrial flutter (HCC)    Dementia (HCC)    Depression    Ejection fraction    H/O alcohol abuse    Low back pain    Multiple thyroid nodules    Wernicke-Korsakoff syndrome (alcoholic) (HCC)     Past Surgical History:  Procedure Laterality Date   ABDOMINAL HYSTERECTOMY  1989   left oophorectomy   ANTERIOR CERVICAL DISCECTOMY     C5- C6, C6- C7 anterior cervical diskectomy and fusion with allograft and anterir plating     scar tissue removal   2004    Family History  Problem Relation Age of Onset   Cancer Mother        bone   Leukemia Father    Diabetes Brother        type 2   Coronary artery disease Neg Hx     Social History   Socioeconomic History   Marital status: Married    Spouse name: Not on file   Number of children: 2   Years of education: Not on file   Highest education level: Not on file  Occupational  History   Occupation: [employer]  Tobacco Use   Smoking status: Former    Packs/day: 1.00    Years: 33.00    Total pack years: 33.00    Types: Cigarettes    Quit date: 08/20/2017    Years since quitting: 4.3   Smokeless tobacco: Never   Tobacco comments:    smoking since 1986  Vaping Use   Vaping Use: Former  Substance and Sexual Activity   Alcohol use: Not Currently    Comment: Last use 2018   Drug use: Never   Sexual activity: Not on file  Other Topics Concern   Not on file  Social History Narrative   Pt lives in 2 story home with her husband   Has 2 adult children   GED - does not remember how far in school prior to GED   Worked in Librarian, academic for a few grocery stores and [employer]   Right handed    Social Determinants of Health   Financial Resource Strain: Not on file  Food Insecurity: Not on file  Transportation Needs: Not on file  Physical Activity: Not on file  Stress: Not on file  Social Connections:  Not on file  Intimate Partner Violence: Not on file    Outpatient Medications Prior to Visit  Medication Sig Dispense Refill   donepezil (ARICEPT) 10 MG tablet 1 tab (10 mg )daily. 30 tablet 11   Multiple Vitamin (MULTIVITAMIN) capsule Take 1 capsule by mouth daily.     No facility-administered medications prior to visit.    No Known Allergies  ROS     Objective:    Physical Exam Constitutional:      General: She is not in acute distress.    Appearance: Normal appearance. She is not ill-appearing.  HENT:     Head: Normocephalic and atraumatic.     Right Ear: External ear normal.     Left Ear: External ear normal.  Eyes:     Extraocular Movements: Extraocular movements intact.     Pupils: Pupils are equal, round, and reactive to light.  Cardiovascular:     Rate and Rhythm: Normal rate and regular rhythm.     Heart sounds: Murmur heard.     Systolic murmur is present with a grade of 1/6.     No gallop.  Pulmonary:     Effort: Pulmonary  effort is normal. No respiratory distress.     Breath sounds: Normal breath sounds. No wheezing or rales.  Skin:    General: Skin is warm and dry.  Neurological:     Mental Status: She is alert and oriented to person, place, and time.  Psychiatric:        Mood and Affect: Mood normal.        Behavior: Behavior normal.        Judgment: Judgment normal.     There were no vitals taken for this visit. Wt Readings from Last 3 Encounters:  10/10/21 167 lb (75.8 kg)  07/13/21 162 lb (73.5 kg)  06/29/21 156 lb 4 oz (70.9 kg)       Assessment & Plan:   Problem List Items Addressed This Visit   None  No orders of the defined types were placed in this encounter.   I, Cassell Clement, personally preformed the services described in this documentation.  All medical record entries made by the scribe were at my direction and in my presence.  I have reviewed the chart and discharge instructions (if applicable) and agree that the record reflects my personal performance and is accurate and complete. 01/11/2022   I,Amber Collins,acting as a scribe for Lemont Fillers, NP.,have documented all relevant documentation on the behalf of Lemont Fillers, NP,as directed by  Lemont Fillers, NP while in the presence of Lemont Fillers, NP.    [employer]

## 2022-01-11 NOTE — Assessment & Plan Note (Signed)
Occasional.  Monitor.

## 2022-01-14 ENCOUNTER — Encounter: Payer: Self-pay | Admitting: Family

## 2022-01-14 DIAGNOSIS — R011 Cardiac murmur, unspecified: Secondary | ICD-10-CM | POA: Insufficient documentation

## 2022-01-14 DIAGNOSIS — F0390 Unspecified dementia without behavioral disturbance: Secondary | ICD-10-CM | POA: Insufficient documentation

## 2022-01-14 NOTE — Assessment & Plan Note (Signed)
Resolved following treatment with podiatry.

## 2022-01-14 NOTE — Assessment & Plan Note (Signed)
New. Will obtain echo for further evaluation.

## 2022-01-14 NOTE — Assessment & Plan Note (Signed)
Had one episode in 2014 and converted to NSR. Subsequent EKG's were NSR.

## 2022-01-14 NOTE — Assessment & Plan Note (Signed)
Felt by neuro to be secondary to Wernike-Korsakoff syndrome. She is maintained on aricept which is being monitored by Neurology. Stable.

## 2022-01-29 ENCOUNTER — Ambulatory Visit (HOSPITAL_BASED_OUTPATIENT_CLINIC_OR_DEPARTMENT_OTHER)
Admission: RE | Admit: 2022-01-29 | Discharge: 2022-01-29 | Disposition: A | Discharge status: Home / Self Care | Payer: 59 | Source: Ambulatory Visit | Attending: Family | Admitting: Family

## 2022-01-29 DIAGNOSIS — R011 Cardiac murmur, unspecified: Secondary | ICD-10-CM | POA: Insufficient documentation

## 2022-01-29 LAB — ECHOCARDIOGRAM COMPLETE
AR max vel: 2.36 cm2
AV Area VTI: 2.05 cm2
AV Area mean vel: 2.15 cm2
AV Mean grad: 8 mmHg
AV Peak grad: 16.8 mmHg
Ao pk vel: 2.05 m/s
Area-P 1/2: 2.21 cm2
S' Lateral: 2.7 cm

## 2022-01-29 NOTE — Progress Notes (Signed)
  Echocardiogram 2D Echocardiogram has been performed.  Merrie Roof F 01/29/2022, 4:58 PM

## 2022-01-30 ENCOUNTER — Encounter: Payer: Self-pay | Admitting: Family

## 2022-01-30 NOTE — Progress Notes (Signed)
Mailed ou to patient 

## 2022-04-05 ENCOUNTER — Ambulatory Visit (INDEPENDENT_AMBULATORY_CARE_PROVIDER_SITE_OTHER): Payer: 59 | Admitting: Physician Assistant

## 2022-04-05 ENCOUNTER — Encounter: Payer: Self-pay | Admitting: Physician Assistant

## 2022-04-05 VITALS — BP 119/74 | HR 72 | Resp 18 | Ht 66.0 in | Wt 176.0 lb

## 2022-04-05 DIAGNOSIS — F0390 Unspecified dementia without behavioral disturbance: Secondary | ICD-10-CM

## 2022-04-05 NOTE — Progress Notes (Incomplete)
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 RH female with a prior history of alcohol abuse, chronic low back pain, depression, atrial flutter presenting today in follow-up for evaluation of memory loss. 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 She was last seen on 04/13/21. She is stable from the cognitive standpoint, today's MMSE is 29/30, improved from 1 year prior (24/30) . Patient is on donepezil 10 mg daily, tolerating well.      Recommendations:   Follow up in 6 months  Continue donepezil 10 mg daily, side effects discussed  Recommend repeating the Neuropsych evaluation for clarity of diagnosis, especially to further evaluate her trouble to remember recent conversations or events,  but patient politely declines at this time. However, she agrees that if cognitive decline is noted during next visit, she will schedule.  Recommend good control of cardiovascular risk factors.   Continue to control mood as per PCP     Subjective:   This patient is accompanied in the office by her husband  who supplements the history. Previous records as well as any outside records available were reviewed prior to todays visit.  Last seen on Dec 2022 at which time her MMSE was 24/30   Any changes in memory since last visit? She reports that her main complaint "I do not have any memory of yesterday's conversations" She does well with names. She is able to do her ADLs without difficulty.  Se enjoys phone games, connect the dots and some puzzles.    repeats oneself?  Endorsed Disoriented when walking into a room?  Patient denies , except occasionally not remembering what patient came to the room for   Leaving objects in unusual places?  Patient denies   Wandering behavior?  Patient denies   Any personality changes since last  visit? Patient apologized during her visit for having a temperamental moment. She states " I have a temper and sorry ".  Any worsening depression?:  Patient denies   Hallucinations or paranoia?  Patient denies   Seizures?   Patient denies    Any sleep changes?  Denies  vivid dreams, REM behavior or sleepwalking   Sleep apnea?  Patient denies   Any hygiene concerns?  Patient denies   Independent of bathing and dressing?  Endorsed  Does the patient needs help with medications?Husband helps her , but she is in charge   Who is in charge of the finances?  Husband is in charge " I used to be the breadwinner " Any changes in appetite?  Patient denies    Patient have trouble swallowing? Patient denies   Does the patient cook?  Any kitchen accidents such as leaving the stove on? Patient cooks without difficulties Any headaches?  Patient denies   Chronic back pain Patient denies   Ambulates with difficulty?   Patient denies   Recent falls or head injuries?  Patient denies     Unilateral weakness, numbness or tingling?  Patient denies   Any tremors?  Patient denies   Any anosmia?  Patient denies   Any incontinence of urine?  Patient denies   Any bowel dysfunction?  I don't go to the bathroom enough.  Patient lives with her husband  Past Medical History:  Diagnosis Date   Atrial flutter (HCC)    Dementia (HCC)    Depression    Ejection  fraction    H/O alcohol abuse    History of atrial flutter 03/10/2013   Presented to [facility] regional with atrial flutter, rate 150, IV Cardizem, converted to normal sinus rhythm, February 09, 2013 and and and and and and and and and and and and and and and and and and and and and and.,  EKGs from [facility] regional received, reviewed, sent to be scanned   History of tobacco abuse 02/08/2010   Qualifier: Diagnosis of  By: Nena Jordan    Low back pain    Multiple thyroid nodules    Wernicke-Korsakoff syndrome (alcoholic) (HCC)      Past Surgical  History:  Procedure Laterality Date   ABDOMINAL HYSTERECTOMY  1989   left oophorectomy   ANTERIOR CERVICAL DISCECTOMY     C5- C6, C6- C7 anterior cervical diskectomy and fusion with allograft and anterir plating     scar tissue removal   2004     PREVIOUS MEDICATIONS:   CURRENT MEDICATIONS:  Outpatient Encounter Medications as of 04/05/2022  Medication Sig   donepezil (ARICEPT) 10 MG tablet 1 tab (10 mg )daily.   Multiple Vitamin (MULTIVITAMIN) capsule Take 1 capsule by mouth daily.   No facility-administered encounter medications on file as of 04/05/2022.     Objective:     PHYSICAL EXAMINATION:    VITALS:   Vitals:   04/05/22 1522  BP: 119/74  Pulse: 72  Resp: 18  SpO2: 98%  Weight: 176 lb (79.8 kg)  Height: 5\' 6"  (1.676 m)    GEN:  The patient appears stated age and is in NAD. HEENT:  Normocephalic, atraumatic.   Neurological examination:  General: NAD, well-groomed, appears stated age. Orientation: The patient is alert. Oriented to person, place and date.  Cranial nerves: There is good facial symmetry.Anxious appearing. The speech is fluent and clear. No aphasia or dysarthria. Fund of knowledge is appropriate. Recent memory impaired and remote memory is normal.  Attention and concentration are normal.  Able to name objects and repeat phrases.  Hearing is intact to conversational tone.    Sensation: Sensation is intact to light touch throughout Motor: Strength is at least antigravity x4. Tremors: none  DTR's 2/4 in UE/LE      04/08/2018   10:00 AM  Montreal Cognitive Assessment   Visuospatial/ Executive (0/5) 3  Naming (0/3) 2  Attention: Read list of digits (0/2) 2  Attention: Read list of letters (0/1) 1  Attention: Serial 7 subtraction starting at 100 (0/3) 0  Language: Repeat phrase (0/2) 0  Language : Fluency (0/1) 0  Abstraction (0/2) 1  Delayed Recall (0/5) 0  Orientation (0/6) 4  Total 13  Adjusted Score (based on education) 14        04/06/2022    3:00 PM 04/13/2021   10:00 AM 07/27/2019   11:00 AM  MMSE - Mini Mental State Exam  Orientation to time 5 3 3   Orientation to Place 5 3 4   Registration 3 3 3   Attention/ Calculation 5 5 4   Recall 2 2 2   Language- name 2 objects 2 2 2   Language- repeat 1 1 1   Language- follow 3 step command 3 3 3   Language- read & follow direction 1 1 1   Write a sentence 1 1 1   Copy design 1 0 0  Total score 29 24 24        Movement examination: Tone: There is normal tone in the UE/LE Abnormal movements:  no tremor.  No myoclonus.  No asterixis.   Coordination:  There is no decremation with RAM's. Normal finger to nose  Gait and Station: The patient has no difficulty arising out of a deep-seated chair without the use of the hands. The patient's stride length is good.  Gait is cautious and narrow.   Thank you for allowing [place] the opportunity to participate in the care of this nice patient. Please do not hesitate to contact us for any questions or concerns.   Total time spent on today's visit was 30 minutes dedicated to this patient today, preparing to see patient, examining the patient, ordering tests and/or medications and counseling the patient, documenting clinical information in the EHR or other health record, independently interpreting results and communicating results to the patient/family, discussing treatment and goals, answering patient's questions and coordinating care.  Cc:  Sandford Craze, NP  Marlowe Kays 04/06/2022 3:25 PM

## 2022-04-05 NOTE — Patient Instructions (Signed)
Continue donepezil 10 mg daily Continue all the other medicines 6 months

## 2022-04-13 ENCOUNTER — Other Ambulatory Visit: Payer: Self-pay | Admitting: Physician Assistant

## 2022-04-17 ENCOUNTER — Other Ambulatory Visit: Payer: Self-pay | Admitting: Physician Assistant

## 2022-04-18 ENCOUNTER — Other Ambulatory Visit: Payer: Self-pay | Admitting: Physician Assistant

## 2022-04-19 ENCOUNTER — Telehealth: Payer: Self-pay | Admitting: Physician Assistant

## 2022-04-19 MED ORDER — DONEPEZIL HCL 10 MG PO TABS: ORAL_TABLET | ORAL | 5 refills | Status: DC

## 2022-04-19 NOTE — Telephone Encounter (Signed)
Refill sent.

## 2022-04-19 NOTE — Telephone Encounter (Signed)
Patients husband was notified that rx was sent to pharmacy.

## 2022-04-19 NOTE — Telephone Encounter (Signed)
Patient needs a refill on the donepezil sent to the [facility] on [address] in [place]

## 2022-06-17 NOTE — Progress Notes (Incomplete)
Subjective:   By signing my name below, I, Marlana Latus, attest that this documentation has been prepared under the direction and in the presence of Nance Pear, NP 06/17/22   Patient ID: Kari Hoffman, female    DOB: January 13, 1961, 62 y.o.   MRN: [id number]  No chief complaint on file.   HPI Patient is in today for a comprehensive physical exam.      Acute: She denies having any fever, new muscle pain, new joint pain, new moles, congestion, sinus pain, sore throat, chest pain, palpitations, cough, SOB, wheezing, n/v/d, constipation, blood in stool, dysuria, frequency, hematuria, or headaches at this time.  Immunizations: She is *** UTD on the flu vaccine this season. She is *** UTD on the Covid vaccine.   Diet:   Last mammogram: 06/26/2021. Results were normal.   Last colonoscopy: Cologuard 04/23/2020 - negative   Last pap: Abdominal hysterectomy.   Dental: She is UTD on routine dental exams.   Vision: She is UTD on routine vision exams.       Past Medical History:  Diagnosis Date   Atrial flutter ([place])    Dementia ([place])    Depression    Ejection fraction    H/O alcohol abuse    History of atrial flutter 03/10/2013   Presented to [facility] regional with atrial flutter, rate 150, IV Cardizem, converted to normal sinus rhythm, February 09, 2013 and and and and and and and and and and and and and and and and and and and and and and.,  EKGs from [facility] regional received, reviewed, sent to be scanned   History of tobacco abuse 02/08/2010   Qualifier: Diagnosis of  By: Wynona Luna    Low back pain    Multiple thyroid nodules    Wernicke-Korsakoff syndrome (alcoholic) ([place])     Past Surgical History:  Procedure Laterality Date   ABDOMINAL HYSTERECTOMY  1989   left oophorectomy   ANTERIOR CERVICAL DISCECTOMY     C5- C6, C6- C7 anterior cervical diskectomy and fusion with allograft and anterir plating     scar tissue removal   2004    Family History   Problem Relation Age of Onset   Cancer Mother        bone   Leukemia Father    Diabetes Brother        type 2   Coronary artery disease Neg Hx     Social History   Socioeconomic History   Marital status: Married    Spouse name: Not on file   Number of children: 2   Years of education: 12   Highest education level: Not on file  Occupational History   Occupation: Advertising copywriter  Tobacco Use   Smoking status: Former    Packs/day: 1.00    Years: 33.00    Total pack years: 33.00    Types: Cigarettes    Quit date: 08/20/2017    Years since quitting: 4.8   Smokeless tobacco: Never   Tobacco comments:    smoking since 1986  Vaping Use   Vaping Use: Former  Substance and Sexual Activity   Alcohol use: Not Currently    Comment: Last use 2018   Drug use: Never   Sexual activity: Not on file  Other Topics Concern   Not on file  Social History Narrative   Pt lives in 2 story home with her husband   Has 2 adult children   GED - does  not remember how far in school prior to GED   Worked in Librarian, 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 30 tablet 5   Multiple Vitamin (MULTIVITAMIN) capsule Take 1 capsule by mouth daily.     No facility-administered medications prior to visit.    No Known Allergies  ROS     Objective:    Physical Exam  There were no vitals taken for this visit. Wt Readings from Last 3 Encounters:  04/05/22 176 lb (79.8 kg)  01/11/22 173 lb (78.5 kg)  10/10/21 167 lb (75.8 kg)       Assessment & Plan:  There are no diagnoses  linked to this encounter.   I,Rachel Rivera,acting as a Education administrator for Nance Pear, NP.,have documented all relevant documentation on the behalf of Nance Pear, NP,as directed by  Nance Pear, NP while in the presence of Nance Pear, NP.   I, Marlana Latus, personally preformed the services described in this documentation.  All medical record entries made by the scribe were at my direction and in my presence.  I have reviewed the chart and discharge instructions (if applicable) and agree that the record reflects my personal performance and is accurate and complete. 06/17/22   Marlana Latus

## 2022-06-18 ENCOUNTER — Encounter: Payer: 59 | Admitting: Family

## 2022-07-02 ENCOUNTER — Encounter: Payer: Self-pay | Admitting: Family

## 2022-07-02 ENCOUNTER — Ambulatory Visit (INDEPENDENT_AMBULATORY_CARE_PROVIDER_SITE_OTHER): Payer: 59 | Admitting: Family

## 2022-07-02 VITALS — BP 117/69 | HR 79 | Temp 98.0°F | Resp 16 | Ht 66.0 in | Wt 176.0 lb

## 2022-07-02 DIAGNOSIS — Z1231 Encounter for screening mammogram for malignant neoplasm of breast: Secondary | ICD-10-CM

## 2022-07-02 DIAGNOSIS — Z0000 Encounter for general adult medical examination without abnormal findings: Secondary | ICD-10-CM

## 2022-07-02 DIAGNOSIS — Z23 Encounter for immunization: Secondary | ICD-10-CM

## 2022-07-02 DIAGNOSIS — Z87891 Personal history of nicotine dependence: Secondary | ICD-10-CM

## 2022-07-02 NOTE — Assessment & Plan Note (Signed)
Cologuard up to date. Pap up to date. Shingrix #2 today. Encouraged her to continue her work on [employer], exercise.  For constipation, recommended that she increase water/fiber- ok to start otc fiber supplement.

## 2022-07-02 NOTE — Progress Notes (Signed)
Subjective:   By signing my name below, I, Kari Hoffman, attest that this documentation has been prepared under the direction and in the presence of Kari Alar, NP. 07/02/2022.   Patient ID: Kari Hoffman, female    DOB: 09/01/1960, 62 y.o.   MRN: [id number]  No chief complaint on file.   HPI Patient is in today for a comprehensive physical exam.  Foot pain:  She complains of ongoing issues with her feet. The corn present on her right foot was removed. However, she continues to have related tenderness and has noticed a raised bump in the area. For treatment she is still using the corn pads.  GI:  She expresses concern that she is having inadequate bowel movements, described as small, hard pellets. She is drinking enough water.   Memory:  She complains of some memory issues. She states that she has some difficulty with remembering last week. She is able to recall what she ate this morning and the activities she completed.  Social history:  She quit smoking 08/2017. No current vaping use.  She denies any changes to her family medical history.  Pap Smear:  Last completed 02/09/2008.  Mammogram:  Last completed 06/26/2021.  Immunizations: She received her first Shingrix vaccine 06/15/2021. We will give her the second vaccination today. Tdap last received 11/18/2018. Influenza vaccine last received 01/11/2022.  Diet:  She endorses a healthy diet.  Exercise:  When the weather permits she will go on routine walks. She also tries to stay active by walking up and down her stairs at home.  Dental/Vision:  She is UTD on her routine examinations.  Denies having any fever, new moles, congestion, sinus pain, sore throat, chest pain, palpitations, cough, SOB, wheezing, n/v/d, blood in stool, dysuria, frequency, hematuria, at this time.  Past Medical History:  Diagnosis Date   Atrial flutter ([place])    Dementia ([place])    Depression    Ejection fraction    H/O alcohol abuse    History of  atrial flutter 03/10/2013   Presented to [facility] regional with atrial flutter, rate 150, IV Cardizem, converted to normal sinus rhythm, February 09, 2013 and and and and and and and and and and and and and and and and and and and and and and.,  EKGs from [facility] regional received, reviewed, sent to be scanned   History of tobacco abuse 02/08/2010   Qualifier: Diagnosis of  By: Wynona Luna    Low back pain    Multiple thyroid nodules    Wernicke-Korsakoff syndrome (alcoholic) ([place])     Past Surgical History:  Procedure Laterality Date   ABDOMINAL HYSTERECTOMY  1989   left oophorectomy   ANTERIOR CERVICAL DISCECTOMY     C5- C6, C6- C7 anterior cervical diskectomy and fusion with allograft and anterir plating     scar tissue removal   2004    Family History  Problem Relation Age of Onset   Cancer Mother        bone   Leukemia Father    Diabetes Brother        type 2   Coronary artery disease Neg Hx     Social History   Socioeconomic History   Marital status: Married    Spouse name: Not on file   Number of children: 2   Years of education: 12   Highest education level: Not on file  Occupational History   Occupation: Advertising copywriter  Tobacco Use  Smoking status: Former    Packs/day: 1.00    Years: 33.00    Total pack years: 33.00    Types: Cigarettes    Quit date: 08/20/2017    Years since quitting: 4.8   Smokeless tobacco: Never   Tobacco comments:    smoking since 1986  Vaping Use   Vaping Use: Former  Substance and Sexual Activity   Alcohol use: Not Currently    Comment: Last use 2018   Drug use: Never   Sexual activity: Not on file  Other Topics Concern   Not on file  Social History Narrative   Pt lives in 2 story home with her husband   Has 2 adult children   GED - does not remember how far in school prior to GED   Worked in Librarian, academic for a few grocery stores and [employer]   Right handed    Social Determinants of Health   Financial  Resource Strain: Not on file  Food Insecurity: Not on file  Transportation Needs: Not on file  Physical Activity: Not on file  Stress: Not on file  Social Connections: Not on file  Intimate Partner Violence: Not on file    Outpatient Medications Prior to Visit  Medication Sig Dispense Refill   donepezil (ARICEPT) 10 MG tablet 1 tab (10 mg )daily. 30 tablet 5   Multiple Vitamin (MULTIVITAMIN) capsule Take 1 capsule by mouth daily.     No facility-administered medications prior to visit.    No Known Allergies  Review of Systems  Constitutional:  Negative for fever.  HENT:  Negative for congestion, sinus pain and sore throat.   Respiratory:  Negative for cough, shortness of breath and wheezing.   Cardiovascular:  Negative for chest pain and palpitations.  Gastrointestinal:  Negative for blood in stool, diarrhea, nausea and vomiting.       Small, infrequent bowel movements.  Genitourinary:  Negative for dysuria, frequency and hematuria.  Musculoskeletal:  Positive for myalgias (Foot pain). Negative for joint pain.       Objective:    Physical Exam Constitutional:      Appearance: Normal appearance.  HENT:     Head: Normocephalic and atraumatic.     Right Ear: Tympanic membrane, ear canal and external ear normal.     Left Ear: Tympanic membrane, ear canal and external ear normal.     Mouth/Throat:     Mouth: Mucous membranes are moist.     Pharynx: Oropharynx is clear. No oropharyngeal exudate or posterior oropharyngeal erythema.  Eyes:     Extraocular Movements: Extraocular movements intact.     Pupils: Pupils are equal, round, and reactive to light.  Cardiovascular:     Rate and Rhythm: Normal rate and regular rhythm.     Heart sounds: Normal heart sounds. No murmur heard.    No gallop.  Pulmonary:     Effort: Pulmonary effort is normal. No respiratory distress.     Breath sounds: Normal breath sounds. No wheezing or rales.  Abdominal:     General: Bowel sounds are  normal. There is no distension.     Palpations: Abdomen is soft.     Tenderness: There is no abdominal tenderness. There is no guarding.  Musculoskeletal:        General: Normal range of motion.     Comments: 5/5 muscle strength of bilateral upper and lower extremities.  Skin:    General: Skin is warm and dry.  Neurological:  General: No focal deficit present.     Mental Status: She is alert and oriented to person, place, and time.     Deep Tendon Reflexes:     Reflex Scores:      Patellar reflexes are 2+ on the right side and 2+ on the left side. Psychiatric:        Mood and Affect: Mood normal.        Behavior: Behavior normal.     BP 117/69 (BP Location: Right Arm, Patient Position: Sitting, Cuff Size: Small)   Pulse 79   Temp 98 F (36.7 C) (Oral)   Resp 16   Ht '5\' 6"'$  (1.676 m)   Wt 176 lb (79.8 kg)   SpO2 99%   BMI 28.41 kg/m  Wt Readings from Last 3 Encounters:  07/02/22 176 lb (79.8 kg)  04/05/22 176 lb (79.8 kg)  01/11/22 173 lb (78.5 kg)     Assessment & Plan:   Problem List Items Addressed This Visit       Unprioritized   Preventative health care    Cologuard up to date. Pap up to date. Shingrix #2 today. Encouraged her to continue her work on [employer], exercise.  For constipation, recommended that she increase water/fiber- ok to start otc fiber supplement.       Relevant Orders   Comp Met (CMET)   TSH   CBC w/Diff   Lipid panel   History of tobacco abuse   Relevant Orders   CT CHEST LUNG CANCER SCREENING LOW DOSE WO CONTRAST   Other Visit Diagnoses     Breast cancer screening by mammogram    -  Primary   Relevant Orders   MM 3D SCREENING MAMMOGRAM BILATERAL BREAST   Need for shingles vaccine       Relevant Orders   Zoster Recombinant (Shingrix ) (Completed)         No orders of the defined types were placed in this encounter.   I, Nance Pear, NP, personally preformed the services described in this documentation.  All  medical record entries made by the scribe were at my direction and in my presence.  I have reviewed the chart and discharge instructions (if applicable) and agree that the record reflects my personal performance and is accurate and complete.  07/02/2022.  I,Mathew Stumpf,acting as a Education administrator for [employer], NP.,have documented all relevant documentation on the behalf of Nance Pear, NP,as directed by  Nance Pear, NP while in the presence of Nance Pear, NP.   Nance Pear, NP

## 2022-07-03 ENCOUNTER — Encounter: Payer: Self-pay | Admitting: Family

## 2022-07-03 LAB — LIPID PANEL
Cholesterol: 172 mg/dL (ref 0–200)
HDL: 62.1 mg/dL (ref >39.00)
LDL Cholesterol: 95 mg/dL (ref 0–99)
NonHDL: 110.26
Total CHOL/HDL Ratio: 3
Triglycerides: 78 mg/dL (ref 0.0–149.0)
VLDL: 15.6 mg/dL (ref 0.0–40.0)

## 2022-07-03 LAB — CBC WITH DIFFERENTIAL/PLATELET
Basophils Absolute: 0.1 10*3/uL (ref 0.0–0.1)
Basophils Relative: 1.3 % (ref 0.0–3.0)
Eosinophils Absolute: 0.2 10*3/uL (ref 0.0–0.7)
Eosinophils Relative: 2.5 % (ref 0.0–5.0)
HCT: 40 % (ref 36.0–46.0)
Hemoglobin: 13.3 g/dL (ref 12.0–15.0)
Lymphocytes Relative: 26.8 % (ref 12.0–46.0)
Lymphs Abs: 2.5 10*3/uL (ref 0.7–4.0)
MCHC: 33.3 g/dL (ref 30.0–36.0)
MCV: 92.4 fl (ref 78.0–100.0)
Monocytes Absolute: 1 10*3/uL (ref 0.1–1.0)
Monocytes Relative: 11 % (ref 3.0–12.0)
Neutro Abs: 5.4 10*3/uL (ref 1.4–7.7)
Neutrophils Relative %: 58.4 % (ref 43.0–77.0)
Platelets: 231 10*3/uL (ref 150.0–400.0)
RBC: 4.33 Mil/uL (ref 3.87–5.11)
RDW: 13.8 % (ref 11.5–15.5)
WBC: 9.2 10*3/uL (ref 4.0–10.5)

## 2022-07-03 LAB — COMPREHENSIVE METABOLIC PANEL
ALT: 16 U/L (ref 0–35)
AST: 19 U/L (ref 0–37)
Albumin: 3.9 g/dL (ref 3.5–5.2)
Alkaline Phosphatase: 74 U/L (ref 39–117)
BUN: 18 mg/dL (ref 6–23)
CO2: 29 mEq/L (ref 19–32)
Calcium: 9.7 mg/dL (ref 8.4–10.5)
Chloride: 105 mEq/L (ref 96–112)
Creatinine, Ser: 0.9 mg/dL (ref 0.40–1.20)
GFR: 68.98 mL/min (ref >60.00)
Glucose, Bld: 109 mg/dL — ABNORMAL HIGH (ref 70–99)
Potassium: 3.6 mEq/L (ref 3.5–5.1)
Sodium: 142 mEq/L (ref 135–145)
Total Bilirubin: 0.2 mg/dL (ref 0.2–1.2)
Total Protein: 6.8 g/dL (ref 6.0–8.3)

## 2022-07-03 LAB — TSH: TSH: 1.85 u[IU]/mL (ref 0.35–5.50)

## 2022-07-05 NOTE — Progress Notes (Signed)
Mailed out to patient 

## 2022-08-06 ENCOUNTER — Telehealth (HOSPITAL_BASED_OUTPATIENT_CLINIC_OR_DEPARTMENT_OTHER): Payer: Self-pay

## 2022-09-09 ENCOUNTER — Ambulatory Visit (INDEPENDENT_AMBULATORY_CARE_PROVIDER_SITE_OTHER): Payer: 59 | Admitting: Podiatry

## 2022-09-09 DIAGNOSIS — D2372 Other benign neoplasm of skin of left lower limb, including hip: Secondary | ICD-10-CM | POA: Diagnosis not present

## 2022-09-09 NOTE — Progress Notes (Signed)
   Chief Complaint  Patient presents with   Callouses    Patient came in today for bilateral callus trim,     Subjective: 62 y.o. female presenting to the office today for followup evaluation of symptomatic skin lesions to the plantar aspect of the left forefoot idiopathic onset.  Patient states that when they were debrided in the office last visit she felt better for several months.  Slowly they have returned.  Presenting for further treatment and evaluation  Past Medical History:  Diagnosis Date   Atrial flutter (HCC)    Dementia (HCC)    Depression    Ejection fraction    H/O alcohol abuse    History of atrial flutter 03/10/2013   Presented to [facility] regional with atrial flutter, rate 150, IV Cardizem, converted to normal sinus rhythm, February 09, 2013 and and and and and and and and and and and and and and and and and and and and and and.,  EKGs from [facility] regional received, reviewed, sent to be scanned   History of tobacco abuse 02/08/2010   Qualifier: Diagnosis of  By: Nena Jordan    Low back pain    Multiple thyroid nodules    Wernicke-Korsakoff syndrome (alcoholic) (HCC)     Past Surgical History:  Procedure Laterality Date   ABDOMINAL HYSTERECTOMY  1989   left oophorectomy   ANTERIOR CERVICAL DISCECTOMY     C5- C6, C6- C7 anterior cervical diskectomy and fusion with allograft and anterir plating     scar tissue removal   2004    No Known Allergies   Objective:  Physical Exam General: Alert and oriented x3 in no acute distress  Dermatology: Hyperkeratotic lesion(s) present on the plantar aspect of the left forefoot x2. Pain on palpation with a central nucleated core noted. Skin is warm, dry and supple bilateral lower extremities. Negative for open lesions or macerations.  Vascular: Palpable pedal pulses bilaterally. No edema or erythema noted. Capillary refill within normal limits.  Neurological: Epicritic and protective threshold grossly intact  bilaterally.   Musculoskeletal Exam: Pain on palpation at the keratotic lesion(s) noted. Range of motion within normal limits bilateral. Muscle strength 5/5 in all groups bilateral.  Assessment: 1.  Symptomatic benign skin lesion/porokeratosis left foot   Plan of Care:  1. Patient evaluated 2. Excisional debridement of keratoic lesion(s) using a chisel blade was performed without incident.  3.  Salicylic acid applied.  Recommend OTC salicylic acid/corn callus remover daily as needed 4. Patient is to return to the clinic PRN.   Felecia Shelling, DPM [facility]  Dr. Felecia Shelling, DPM    [address] [place], [place] [address]                Office [phone]  Fax [phone]

## 2022-10-07 ENCOUNTER — Ambulatory Visit (INDEPENDENT_AMBULATORY_CARE_PROVIDER_SITE_OTHER): Payer: 59 | Admitting: Physician Assistant

## 2022-10-07 ENCOUNTER — Encounter: Payer: Self-pay | Admitting: Physician Assistant

## 2022-10-07 VITALS — BP 146/75 | HR 62 | Resp 18 | Ht 66.0 in | Wt 176.0 lb

## 2022-10-07 DIAGNOSIS — F0390 Unspecified dementia without behavioral disturbance: Secondary | ICD-10-CM

## 2022-10-07 MED ORDER — DONEPEZIL HCL 10 MG PO TABS: ORAL_TABLET | ORAL | 3 refills | Status: DC

## 2022-10-07 NOTE — Patient Instructions (Signed)
Continue donepezil 10 mg daily Continue all the other medicines 1 year follow up.

## 2022-10-07 NOTE — Progress Notes (Signed)
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 Patient is currently on donepezil 10 mg daily. MMSE is 25/30. Memory is stable. Patient is able to participate on his IADLs.  Mood is controlled.     Recommendations    Follow up in 1 year   Continue donepezil 10 mg daily, side effects discussed Recommend good control of her cardiovascular risk factors Continue to control mood as per PCP    Subjective:    This patient is accompanied in the office by her husband  who supplements the history.  Previous records as well as any outside records available were reviewed prior to todays visit. Patient was last seen on 04/06/2022 with MMSE of 29/30.    Any changes in memory since last visit?  She denies any new issues with memory.  She has difficulty with recent conversations, but does well with names.  She is able to perform her ADLs without difficulty.  She enjoys phone games, connecting the dots and some puzzles. repeats oneself?  Endorsed Disoriented when walking into a room?  Patient denies   Leaving objects in unusual places?    denies   Wandering behavior?  denies   Any personality changes since last visit?  denies   Any worsening depression?:  denies   Hallucinations or paranoia?  denies   Seizures?    denies    Any sleep changes?  Denies vivid dreams, REM behavior or sleepwalking   Sleep apnea?   denies   Any hygiene concerns?    denies   Independent of bathing and dressing?  Endorsed  Does the patient needs help with medications?  Patient is in charge although husband monitors. Who is in charge of the finances?  Husband is in charge    Any changes in appetite?  denies    Patient have  trouble swallowing?  denies   Does the patient cook?  No     Any headaches?   denies   Chronic back pain  denies   Ambulates with difficulty?     denies   Recent falls or head injuries? denies     Unilateral weakness, numbness or tingling?    denies   Any tremors?  denies   Any anosmia?  Patient denies   Any incontinence of urine?  denies   Any bowel dysfunction?     denies      Patient lives with her husband Does the patient drive? No   PREVIOUS MEDICATIONS:   CURRENT MEDICATIONS:  Outpatient Encounter Medications as of 10/07/2022  Medication Sig   donepezil (ARICEPT) 10 MG tablet 1 tab (10 mg )daily.   Multiple Vitamin (MULTIVITAMIN) capsule Take 1 capsule by mouth daily.   [DISCONTINUED] donepezil (ARICEPT) 10 MG tablet 1 tab (10 mg )daily.   No facility-administered encounter medications on file as of 10/07/2022.       10/07/2022    5:00 PM 04/06/2022    3:00 PM 04/13/2021   10:00 AM  MMSE - Mini Mental State Exam  Orientation to time 2 5 3   Orientation to Place 4 5 3   Registration 3 3 3   Attention/ Calculation 5 5 5   Recall 2 2 2   Language- name 2 objects 2  2 2  Language- repeat 1 1 1   Language- follow 3 step command 3 3 3   Language- read & follow direction 1 1 1   Write a sentence 1 1 1   Copy design 1 1 0  Total score 25 29 24       04/08/2018   10:00 AM  Montreal Cognitive Assessment   Visuospatial/ Executive (0/5) 3  Naming (0/3) 2  Attention: Read list of digits (0/2) 2  Attention: Read list of letters (0/1) 1  Attention: Serial 7 subtraction starting at 100 (0/3) 0  Language: Repeat phrase (0/2) 0  Language : Fluency (0/1) 0  Abstraction (0/2) 1  Delayed Recall (0/5) 0  Orientation (0/6) 4  Total 13  Adjusted Score (based on education) 14    Objective:     PHYSICAL EXAMINATION:    VITALS:   Vitals:   10/07/22 1544 10/07/22 1601  BP: (!) 154/78 (!) 146/75  Pulse: 62   Resp: 18   SpO2: 98%   Weight: 176 lb (79.8 kg)   Height: 5\' 6"  (1.676  m)     GEN:  The patient appears stated age and is in NAD. HEENT:  Normocephalic, atraumatic.   Neurological examination:  General: NAD, well-groomed, appears stated age. Orientation: The patient is alert.  Flat affect. Oriented to person, place and not to day of the week or date. Cranial nerves: There is good facial symmetry.The speech is fluent and clear. No aphasia or dysarthria. Fund of knowledge is appropriate. Recent memory impaired, remote memory normal. Attention and concentration are normal. Able to name objects and repeat phrases.  Hearing is intact to conversational tone.  Delayed recall 2/3  Sensation: Sensation is intact to light touch throughout Motor: Strength is at least antigravity x4. DTR's 2/4 in UE/LE     Movement examination: Tone: There is normal tone in the UE/LE Abnormal movements:  no tremor.  No myoclonus.  No asterixis.   Coordination:  There is no decremation with RAM's. Normal finger to nose  Gait and Station: The patient has no difficulty arising out of a deep-seated chair without the use of the hands. The patient's stride length is good.  Gait is cautious and narrow.    Thank you for allowing [place] the opportunity to participate in the care of this nice patient. Please do not hesitate to contact us for any questions or concerns.   Total time spent on today's visit was 15 minutes dedicated to this patient today, preparing to see patient, examining the patient, ordering tests and/or medications and counseling the patient, documenting clinical information in the EHR or other health record, independently interpreting results and communicating results to the patient/family, discussing treatment and goals, answering patient's questions and coordinating care.  Cc:  Sandford Craze, NP  Marlowe Kays 10/07/2022 5:06 PM

## 2022-12-17 ENCOUNTER — Ambulatory Visit (INDEPENDENT_AMBULATORY_CARE_PROVIDER_SITE_OTHER): Payer: 59 | Admitting: Family

## 2022-12-17 VITALS — BP 128/67 | HR 59 | Temp 98.5°F | Resp 16 | Wt 180.0 lb

## 2022-12-17 DIAGNOSIS — R040 Epistaxis: Secondary | ICD-10-CM | POA: Insufficient documentation

## 2022-12-17 NOTE — Assessment & Plan Note (Signed)
New. Will refer to ENT. She is advised to go to the ER if she develops a severe nose bleed or if she cannot stop the bleeding after 20 minutes.

## 2022-12-17 NOTE — Progress Notes (Signed)
Subjective:     Patient ID: Kari Hoffman, female    DOB: [birth year], 62 y.o.   MRN: [id number]  Chief Complaint  Patient presents with   Epistaxis    Patient complains of nose bleeds 2 to 3 times per week    Epistaxis      History of Present Illness          Patient is a 62 yr old female who presents today with her husband to discuss epistaxis. She reports that for the last few months she has had intermittent nose bleeds lasting a few minutes that occur 2-3 times a week.  These episodes are not precipitated by nose blowing.      Health Maintenance Due  Topic Date Due   COVID-19 Vaccine (1) Never done   Hepatitis C Screening  Never done   Lung Cancer Screening  Never done   INFLUENZA VACCINE  11/21/2022    Past Medical History:  Diagnosis Date   Atrial flutter (HCC)    Dementia (HCC)    Depression    Ejection fraction    H/O alcohol abuse    History of atrial flutter 03/10/2013   Presented to [facility] with atrial flutter, rate 150, IV Cardizem, converted to normal sinus rhythm, February 09, 2013 and and and and and and and and and and and and and and and and and and and and and and.,  EKGs from [facility] received, reviewed, sent to be scanned   History of tobacco abuse 02/08/2010   Qualifier: Diagnosis of  By: Nena Jordan    Low back pain    Multiple thyroid nodules    Wernicke-Korsakoff syndrome (alcoholic) (HCC)     Past Surgical History:  Procedure Laterality Date   ABDOMINAL HYSTERECTOMY  1989   left oophorectomy   ANTERIOR CERVICAL DISCECTOMY     C5- C6, C6- C7 anterior cervical diskectomy and fusion with allograft and anterir plating     scar tissue removal   2004    Family History  Problem Relation Age of Onset   Cancer Mother        bone   Leukemia Father    Diabetes Brother        type 2   Coronary artery disease Neg Hx     Social History   Socioeconomic History   Marital status: Married    Spouse name: Not on  file   Number of children: 2   Years of education: 12   Highest education level: Not on file  Occupational History   Occupation: Actor  Tobacco Use   Smoking status: Former    Current packs/day: 0.00    Average packs/day: 1 pack/day for 33.0 years (33.0 ttl pk-yrs)    Types: Cigarettes    Start date: 08/20/1984    Quit date: 08/20/2017    Years since quitting: 5.3   Smokeless tobacco: Never   Tobacco comments:    smoking since 1986  Vaping Use   Vaping status: Former  Substance and Sexual Activity   Alcohol use: Not Currently    Comment: Last use 2018   Drug use: Never   Sexual activity: Not on file  Other Topics Concern   Not on file  Social History Narrative   Pt lives in 2 story home with her husband   Has 2 adult children   GED - does not remember how far in school prior to GED   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 90 tablet 3   Multiple Vitamin (MULTIVITAMIN) capsule Take 1 capsule by mouth daily.     No facility-administered medications prior to visit.    No Known Allergies  Review of Systems  HENT:  Positive for nosebleeds.        Objective:    Physical Exam Constitutional:      Appearance: Normal appearance.  HENT:     Head: Normocephalic and atraumatic.     Right Ear: Tympanic membrane and ear canal normal.     Left Ear: Tympanic membrane and ear canal normal.     Nose: Nose normal.     Right Nostril: No foreign body, epistaxis, septal hematoma or occlusion.     Left Nostril: No foreign body, epistaxis,  septal hematoma or occlusion.  Neurological:     Mental Status: She is alert.      BP 128/67 (BP Location: Right Arm, Patient Position: Sitting, Cuff Size: Small)   Pulse (!) 59   Temp 98.5 F (36.9 C) (Oral)   Resp 16   Wt 180 lb (81.6 kg)   SpO2 98%   BMI 29.05 kg/m  Wt Readings from Last 3 Encounters:  12/17/22 180 lb (81.6 kg)  10/07/22 176 lb (79.8 kg)  07/02/22 176 lb (79.8 kg)       Assessment & Plan:   Problem List Items Addressed This Visit       Unprioritized   Recurrent epistaxis - Primary    New. Will refer to ENT. She is advised to go to the ER if she develops a severe nose bleed or if she cannot stop the bleeding after 20 minutes.      Relevant Orders   Ambulatory referral to ENT    I am having Kari Hoffman maintain her multivitamin and donepezil.  No orders of the defined types were placed in this encounter.

## 2023-04-27 ENCOUNTER — Other Ambulatory Visit: Payer: Self-pay | Admitting: Family

## 2023-04-27 DIAGNOSIS — Z1211 Encounter for screening for malignant neoplasm of colon: Secondary | ICD-10-CM

## 2023-05-12 ENCOUNTER — Encounter: Payer: Self-pay | Admitting: Podiatry

## 2023-05-12 ENCOUNTER — Ambulatory Visit (INDEPENDENT_AMBULATORY_CARE_PROVIDER_SITE_OTHER): Payer: 59 | Admitting: Podiatry

## 2023-05-12 DIAGNOSIS — D2372 Other benign neoplasm of skin of left lower limb, including hip: Secondary | ICD-10-CM

## 2023-05-12 NOTE — Progress Notes (Unsigned)
   Chief Complaint  Patient presents with   Callouses    Trim calluses bilateral     Subjective: 63 y.o. female presenting to the office today for followup evaluation of symptomatic skin lesions to the plantar aspect of the left forefoot idiopathic onset.  Patient states that when they were debrided in the office last visit she felt better for several months.  Slowly they have returned.  Presenting for further treatment and evaluation  Past Medical History:  Diagnosis Date   Atrial flutter (HCC)    Dementia (HCC)    Depression    Ejection fraction    H/O alcohol abuse    History of atrial flutter 03/10/2013   Presented to [facility] with atrial flutter, rate 150, IV Cardizem, converted to normal sinus rhythm, February 09, 2013 and and and and and and and and and and and and and and and and and and and and and and.,  EKGs from [facility] received, reviewed, sent to be scanned   History of tobacco abuse 02/08/2010   Qualifier: Diagnosis of  By: Nena Jordan    Low back pain    Multiple thyroid nodules    Wernicke-Korsakoff syndrome (alcoholic) (HCC)     Past Surgical History:  Procedure Laterality Date   ABDOMINAL HYSTERECTOMY  1989   left oophorectomy   ANTERIOR CERVICAL DISCECTOMY     C5- C6, C6- C7 anterior cervical diskectomy and fusion with allograft and anterir plating     scar tissue removal   2004    No Known Allergies   Objective:  Physical Exam General: Alert and oriented x3 in no acute distress  Dermatology: Hyperkeratotic lesion(s) present on the plantar aspect of the left forefoot x2. Pain on palpation with a central nucleated core noted. Skin is warm, dry and supple bilateral lower extremities. Negative for open lesions or macerations.  Vascular: Palpable pedal pulses bilaterally. No edema or erythema noted. Capillary refill within normal limits.  Neurological: Grossly intact via light touch  Musculoskeletal Exam: Pain on palpation at  the keratotic lesion(s) noted. Range of motion within normal limits bilateral. Muscle strength 5/5 in all groups bilateral.  Assessment: 1.  Symptomatic benign skin lesion/porokeratosis left foot   Plan of Care:  -Patient evaluated -Excisional debridement of keratoic lesion(s) using a chisel blade was performed without incident.  -Salicylic acid applied.  Recommend OTC salicylic acid/corn callus remover daily as needed -Patient is to return to the clinic PRN.   Felecia Shelling, DPM [facility]  Dr. Felecia Shelling, DPM    [address] [place], [place] [address]                Office [phone]  Fax [phone]

## 2023-05-20 LAB — COLOGUARD

## 2023-05-30 ENCOUNTER — Telehealth: Payer: Self-pay | Admitting: Family

## 2023-05-30 LAB — COLOGUARD

## 2023-05-30 NOTE — Telephone Encounter (Signed)
 It looks like the patient has submitted cologuard twice without adequate specimen. Could you please reach out to her husband to see if he could assist her with her next attempt?  They should mail out a new kit.

## 2023-06-02 NOTE — Telephone Encounter (Signed)
 Patient's husband called us  back and was informed of information on message

## 2023-06-02 NOTE — Telephone Encounter (Signed)
 Called patient but no answer, left voice mail for patient's husband to call back.

## 2023-06-12 LAB — COLOGUARD: COLOGUARD: NEGATIVE

## 2023-07-08 ENCOUNTER — Encounter: Payer: 59 | Admitting: Family

## 2023-07-11 ENCOUNTER — Ambulatory Visit: Admitting: Family

## 2023-07-18 ENCOUNTER — Ambulatory Visit: Admitting: Family

## 2023-07-18 VITALS — BP 139/74 | HR 59 | Temp 98.5°F | Resp 16 | Ht 66.0 in | Wt 174.0 lb

## 2023-07-18 DIAGNOSIS — Z0000 Encounter for general adult medical examination without abnormal findings: Secondary | ICD-10-CM

## 2023-07-18 DIAGNOSIS — I10 Essential (primary) hypertension: Secondary | ICD-10-CM

## 2023-07-18 DIAGNOSIS — F0390 Unspecified dementia without behavioral disturbance: Secondary | ICD-10-CM

## 2023-07-18 NOTE — Assessment & Plan Note (Signed)
 Managed with aricept, husband reports memory is stable.  Follows with Neurology.

## 2023-07-18 NOTE — Progress Notes (Signed)
   Established Patient Office Visit  Subjective   Patient ID: Kari Hoffman, female    DOB: 1960/06/14  Age: 63 y.o. MRN: [id number]  Chief Complaint  Patient presents with   Hypertension    Here for follow up    Patient presents today for routine follow-up. She states she is feeling well overall. She is accompanied by her husband who also participated in providing history. The husband states that the patient is eating well and he has no concerns.    Hypertension Pertinent negatives include no blurred vision, chest pain, headaches or shortness of breath.      Review of Systems  Constitutional:  Negative for chills and fever.  HENT:  Negative for ear pain and hearing loss.   Eyes:  Negative for blurred vision and double vision.  Respiratory:  Negative for cough and shortness of breath.   Cardiovascular:  Negative for chest pain.  Gastrointestinal:  Negative for abdominal pain and heartburn.  Genitourinary:  Negative for dysuria.  Musculoskeletal:  Negative for joint pain and myalgias.  Skin:  Negative for rash.  Neurological:  Negative for dizziness and headaches.  Endo/Heme/Allergies:  Does not bruise/bleed easily.      Objective:     BP 139/74 (BP Location: Right Arm, Patient Position: Sitting, Cuff Size: Normal)   Pulse (!) 59   Temp 98.5 F (36.9 C) (Oral)   Resp 16   Ht 5\' 6"  (1.676 m)   Wt 174 lb (78.9 kg)   SpO2 97%   BMI 28.08 kg/m    Wt Readings from Last 3 Encounters:  07/18/23 174 lb (78.9 kg)  12/17/22 180 lb (81.6 kg)  10/07/22 176 lb (79.8 kg)     Physical Exam Vitals reviewed.  Constitutional:      General: She is not in acute distress.    Appearance: Normal appearance.  HENT:     Head: Normocephalic and atraumatic.     Right Ear: Tympanic membrane normal.     Left Ear: Tympanic membrane normal.     Mouth/Throat:     Mouth: Mucous membranes are moist.     Pharynx: Oropharynx is clear.  Eyes:     Conjunctiva/sclera: Conjunctivae normal.      Pupils: Pupils are equal, round, and reactive to light.  Cardiovascular:     Rate and Rhythm: Normal rate and regular rhythm.     Pulses: Normal pulses.     Heart sounds: Normal heart sounds.  Pulmonary:     Effort: Pulmonary effort is normal.     Breath sounds: Normal breath sounds.  Abdominal:     General: Bowel sounds are normal.     Palpations: Abdomen is soft.  Skin:    General: Skin is warm and dry.     Capillary Refill: Capillary refill takes less than 2 seconds.  Neurological:     Mental Status: She is alert.  Psychiatric:        Mood and Affect: Affect is flat.        Speech: Speech normal.        Behavior: Behavior is slowed. Behavior is cooperative.      Assessment & Plan:   -Preventative health care - declines LDCT lung cancer screening,  mammogram, and Pap.  -Hypertension - BP stable without medication.   -Major neurocognitive disorder - followed by neurology. Husband reports this is stable with no recent deterioration. Continue Aricept.    Cristopher Peru, RN

## 2023-07-18 NOTE — Assessment & Plan Note (Signed)
 Stable without medication. Continue same.

## 2023-07-18 NOTE — Progress Notes (Signed)
 Subjective:     Patient ID: Kari Hoffman, female    DOB: 1960/09/02, 63 y.o.   MRN: [id number]  Chief Complaint  Patient presents with   Hypertension    Here for follow up    Hypertension    Discussed the use of AI scribe software for clinical note transcription with the patient, who gave verbal consent to proceed.  History of Present Illness  Kari Hoffman is a 63 year old female who presents for routine follow-up and preventive care discussion. She is accompanied by her spouse, who provides much of the history.  She has not had a mammogram in many years and is reluctant to undergo this screening, stating she does not see the need for it. She also has not had a Pap smear since 2009, which was normal, and questions the necessity of repeating it. She recently completed a Cologuard test, which was negative.  She has a history of smoking, making her a candidate for lung cancer screening. However, she is hesitant about undergoing a CT scan, stating she hasn't been coughing or experiencing any phlegm.  She relies on her spouse to answer many questions. She and her husband report that her memory is about the same as before.     Health Maintenance Due  Topic Date Due   COVID-19 Vaccine (1) Never done   Hepatitis C Screening  Never done   Cervical Cancer Screening (HPV/Pap Cotest)  02/08/2013   INFLUENZA VACCINE  11/21/2022    Past Medical History:  Diagnosis Date   Atrial flutter (HCC)    Dementia (HCC)    Depression    Ejection fraction    H/O alcohol abuse    History of atrial flutter 03/10/2013   Presented to [facility] with atrial flutter, rate 150, IV Cardizem, converted to normal sinus rhythm, February 09, 2013 and and and and and and and and and and and and and and and and and and and and and and.,  EKGs from [facility] received, reviewed, sent to be scanned   History of tobacco abuse 02/08/2010   Qualifier: Diagnosis of  By: Nena Jordan    Low  back pain    Multiple thyroid nodules    Wernicke-Korsakoff syndrome (alcoholic) (HCC)     Past Surgical History:  Procedure Laterality Date   ABDOMINAL HYSTERECTOMY  1989   left oophorectomy   ANTERIOR CERVICAL DISCECTOMY     C5- C6, C6- C7 anterior cervical diskectomy and fusion with allograft and anterir plating     scar tissue removal   2004    Family History  Problem Relation Age of Onset   Cancer Mother        bone   Leukemia Father    Diabetes Brother        type 2   Coronary artery disease Neg Hx     Social History   Socioeconomic History   Marital status: Married    Spouse name: Not on file   Number of children: 2   Years of education: 12   Highest education level: Not on file  Occupational History   Occupation: Actor  Tobacco Use   Smoking status: Former    Current packs/day: 0.00    Average packs/day: 1 pack/day for 33.0 years (33.0 ttl pk-yrs)    Types: Cigarettes    Start date: 08/20/1984    Quit date: 08/20/2017    Years since quitting: 5.9   Smokeless tobacco:  Never   Tobacco comments:    smoking since 1986  Vaping Use   Vaping status: Former  Substance and Sexual Activity   Alcohol use: Not Currently    Comment: Last use 2018   Drug use: Never   Sexual activity: Not on file  Other Topics Concern   Not on file  Social History Narrative   Pt lives in 2 story home with her husband   Has 2 adult children   GED - does not remember how far in school prior to GED   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 90  tablet 3   Multiple Vitamin (MULTIVITAMIN) capsule Take 1 capsule by mouth daily.     No facility-administered medications prior to visit.    No Known Allergies  ROS See HPI    Objective:    Physical Exam Constitutional:      General: She is not in acute distress.    Appearance: Normal appearance. She is well-developed.  HENT:     Head: Normocephalic and atraumatic.     Right Ear: External ear normal.     Left Ear: External ear normal.  Eyes:     General: No scleral icterus. Neck:     Thyroid: No thyromegaly.  Cardiovascular:     Rate and Rhythm: Normal rate and regular rhythm.     Heart sounds: Normal heart sounds. No murmur heard. Pulmonary:     Effort: Pulmonary effort is normal. No respiratory distress.     Breath sounds: Normal breath sounds. No wheezing.  Musculoskeletal:     Cervical back: Neck supple.  Skin:    General: Skin is warm and dry.  Neurological:     Mental Status: She is alert and oriented to person, place, and time.  Psychiatric:        Mood and Affect: Mood normal. Affect is flat.        Behavior: Behavior normal.        Thought Content: Thought content normal.        Cognition and Memory: Memory is impaired.        Judgment: Judgment normal.      BP 139/74 (BP Location: Right Arm, Patient Position: Sitting, Cuff Size: Normal)   Pulse (!) 59   Temp 98.5 F (36.9 C) (Oral)   Resp 16   Ht 5\' 6"  (1.676 m)   Wt 174 lb (78.9 kg)   SpO2 97%   BMI 28.08 kg/m  Wt Readings from Last 3 Encounters:  07/18/23 174 lb (78.9 kg)  12/17/22 180 lb (81.6 kg)  10/07/22 176 lb (79.8 kg)       Assessment & Plan:   Problem List Items Addressed This Visit       Unprioritized   Preventative health care   Declines pap, lung cancer screening CT and mammogram.      Major neurocognitive disorder (HCC)   Managed with aricept, husband reports memory is stable.  Follows with Neurology.  Hypertension - Primary   Stable without medication.  Continue same.        I am having Philis Kendall maintain her multivitamin and donepezil.  No orders of the defined types were placed in this encounter.

## 2023-07-18 NOTE — Assessment & Plan Note (Addendum)
 Declines pap, lung cancer screening CT and mammogram.

## 2023-08-14 ENCOUNTER — Encounter: Payer: Self-pay | Admitting: Physician Assistant

## 2023-09-18 ENCOUNTER — Ambulatory Visit (INDEPENDENT_AMBULATORY_CARE_PROVIDER_SITE_OTHER): Admitting: Physician Assistant

## 2023-09-18 ENCOUNTER — Encounter: Payer: Self-pay | Admitting: Physician Assistant

## 2023-09-18 VITALS — BP 90/50 | HR 78 | Resp 20 | Ht 66.0 in

## 2023-09-18 DIAGNOSIS — F0390 Unspecified dementia without behavioral disturbance: Secondary | ICD-10-CM

## 2023-09-18 MED ORDER — DONEPEZIL HCL 10 MG PO TABS: ORAL_TABLET | ORAL | 3 refills | Status: AC

## 2023-09-18 NOTE — Patient Instructions (Signed)
Continue donepezil 10 mg daily Continue all the other medicines 1 year follow up.

## 2023-09-18 NOTE — Progress Notes (Signed)
 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 Patient is currently on donepezil  10 mg daily, tolerating well. Memory is stable. MMSE today is 26/30.  Mood is controlled.     Follow up in  1 year Continue donepezil  10 mg daily, side effects discussed Recommend good control of her cardiovascular risk factors Continue to control mood as per PCP     Subjective:    This patient is accompanied in the office by her husband who supplements the history.  Previous records as well as any outside records available were reviewed prior to todays visit. Patient was last seen on 10/07/2022 with MMSE 25/30   Any changes in memory since last visit? "About the same".  She continues to have some difficulties with short-term memory, such as with recent conversations and names.  She enjoys some games, connecting dots, some puzzles. repeats oneself?  Endorsed Disoriented when walking into a room? Denies   Leaving objects?  May misplace things but not in unusual places  Wandering behavior?  denies   Any personality changes since last visit?  Denies.   Any worsening depression?:  Denies.   Hallucinations or paranoia?  Denies.   Seizures? denies    Any sleep changes?  Denies vivid dreams, REM behavior or sleepwalking   Sleep apnea?   Denies.   Any hygiene concerns? Denies.  Independent of bathing and dressing?  Endorsed  Does the patient needs help with medications?  Patient is in charge with husband monitoring Who is in charge of the finances?  Husband is in charge    Any changes in appetite?  denies    Patient have trouble swallowing? Denies.   Does the patient cook? No Any headaches?   denies   Any vision changes? Denies   Chronic back pain  denies   Ambulates with difficulty? Denies.    Recent falls or head injuries? Denies.     Unilateral weakness, numbness or tingling? denies   Any tremors?  Denies   Any anosmia?  Denies   Any incontinence of urine? Denies  Any bowel dysfunction?   Denies      Patient lives with her husband     Does the patient drive? No longer drives     PREVIOUS MEDICATIONS:   CURRENT MEDICATIONS:  Outpatient Encounter Medications as of 09/18/2023  Medication Sig   Multiple Vitamin (MULTIVITAMIN) capsule Take 1 capsule by mouth daily.   [DISCONTINUED] donepezil  (ARICEPT ) 10 MG tablet 1 tab (10 mg )daily.   donepezil  (ARICEPT ) 10 MG tablet 1 tab (10 mg )daily.   No facility-administered encounter medications on file as of 09/18/2023.       09/18/2023    6:00 PM 10/07/2022    5:00 PM 04/06/2022    3:00 PM  MMSE - Mini Mental State Exam  Orientation to time 5 2 5   Orientation to Place 5 4 5   Registration 3 3 3   Attention/ Calculation 5 5 5   Recall 3 2 2   Language- name 2 objects 2 2 2   Language- repeat 1 1 1   Language- follow 3 step command 1 3 3   Language- read & follow direction 1 1 1   Write a sentence 0 1 1  Copy design  0 1 1  Total score 26 25 29       04/08/2018   10:00 AM  Montreal Cognitive Assessment   Visuospatial/ Executive (0/5) 3  Naming (0/3) 2  Attention: Read list of digits (0/2) 2  Attention: Read list of letters (0/1) 1  Attention: Serial 7 subtraction starting at 100 (0/3) 0  Language: Repeat phrase (0/2) 0  Language : Fluency (0/1) 0  Abstraction (0/2) 1  Delayed Recall (0/5) 0  Orientation (0/6) 4  Total 13  Adjusted Score (based on education) 14    Objective:     PHYSICAL EXAMINATION:    VITALS:   Vitals:   09/18/23 1508  BP: (!) 90/50  Pulse: 78  Resp: 20  SpO2: 100%  Height: 5\' 6"  (1.676 m)    GEN:  The patient appears stated age and is in NAD. HEENT:  Normocephalic, atraumatic.   Neurological examination:  General:  NAD, well-groomed, appears stated age. Orientation: The patient is alert. Oriented to person, place and date Cranial nerves: There is good facial symmetry. Speech is fluent and clear. No aphasia or dysarthria. Fund of knowledge is appropriate. Recent and remote memory are impaired. Attention and concentration are reduced. Able to name objects and repeat phrases.  Hearing is intact to conversational tone.  Sensation: Sensation is intact to light touch throughout Motor: Strength is at least antigravity x4. DTR's 2/4 in UE/LE     Movement examination: Tone: There is normal tone in the UE/LE Abnormal movements:  no tremor.  No myoclonus.  No asterixis.   Coordination:  There is no decremation with RAM's. Normal finger to nose  Gait and Station: The patient has no difficulty arising out of a deep-seated chair without the use of the hands. The patient's stride length is good.  Gait is cautious and narrow.    Thank you for allowing us  the opportunity to participate in the care of this nice patient. Please do not hesitate to contact us  for any questions or concerns.   Total time spent on today's visit was 20 minutes dedicated to this patient today, preparing to see patient, examining the patient, ordering tests and/or medications and counseling the patient, documenting clinical information in the EHR or other health record, independently interpreting results and communicating results to the patient/family, discussing treatment and goals, answering patient's questions and coordinating care.  Cc:  Dorrene Gaucher, NP  Tex Filbert 09/18/2023 6:04 PM

## 2023-10-07 ENCOUNTER — Ambulatory Visit: Payer: 59 | Admitting: Physician Assistant

## 2024-01-20 ENCOUNTER — Ambulatory Visit (INDEPENDENT_AMBULATORY_CARE_PROVIDER_SITE_OTHER): Admitting: Family

## 2024-01-20 VITALS — BP 126/61 | HR 58 | Temp 97.8°F | Resp 16 | Ht 66.0 in | Wt 176.0 lb

## 2024-01-20 DIAGNOSIS — F32A Depression, unspecified: Secondary | ICD-10-CM | POA: Diagnosis not present

## 2024-01-20 DIAGNOSIS — M545 Low back pain, unspecified: Secondary | ICD-10-CM | POA: Diagnosis not present

## 2024-01-20 DIAGNOSIS — I10 Essential (primary) hypertension: Secondary | ICD-10-CM

## 2024-01-20 DIAGNOSIS — R040 Epistaxis: Secondary | ICD-10-CM

## 2024-01-20 NOTE — Assessment & Plan Note (Signed)
 BP Readings from Last 3 Encounters:  01/20/24 126/61  09/18/23 (!) 90/50  07/18/23 139/74   Stable without medications.

## 2024-01-20 NOTE — Patient Instructions (Addendum)
 VISIT SUMMARY:  Today, you came in for a routine follow-up visit. Your previous concerns about nosebleeds have resolved, and your back pain is well-managed. Your memory issues are stable, and your blood pressure is well-controlled without medication. You have declined various vaccinations and screenings.  YOUR PLAN:  MEMORY LOSS: Your memory issues are stable. -Continue taking Aricept  as prescribed by neurology.  HISTORY OF ELEVATED BLOOD PRESSURE, NOT ON MEDICATION: Your blood pressure is well-controlled without medication. -Monitor your blood pressure regularly.  GENERAL HEALTH MAINTENANCE: You have declined flu shot, pneumonia shot, hepatitis C screening, and COVID shot. You are overdue for a Pap smear but have declined the referral or in-office procedure. You have also previously declined mammogram and lung cancer screening. -Schedule your annual physical exam in one year.

## 2024-01-20 NOTE — Assessment & Plan Note (Signed)
 Stable without medications.

## 2024-01-20 NOTE — Assessment & Plan Note (Signed)
Stable, no recent issues.

## 2024-01-20 NOTE — Assessment & Plan Note (Signed)
 Resolved, never followed through with ENT referral.

## 2024-01-20 NOTE — Progress Notes (Signed)
 Subjective:     Patient ID: Kari Hoffman, female    DOB: 04-Mar-1961, 63 y.o.   MRN: [id number]  Chief Complaint  Patient presents with   Hypertension    Here for follow up    HPI  Discussed the use of AI scribe software for clinical note transcription with the patient, who gave verbal consent to proceed.  History of Present Illness  Kari Hoffman is a 63 year old female who presents for a routine follow-up visit. She is accompanied by her husband.   Her previous concern of epistaxis has resolved. Her back pain is well-managed. She is not on antihypertensive medication but recalls elevated blood pressure. She denies symptoms of depression or anxiety and describes her mood as good. She is taking Aricept  for memory issues with no change reported. She declines vaccinations and screenings, including flu shot, Pap smear, mammogram, lung cancer screening, pneumonia shot, hepatitis C screening, and COVID shot.    Past Medical History:  Diagnosis Date   Atrial flutter (HCC)    Dementia (HCC)    Depression    Ejection fraction    H/O alcohol abuse    History of atrial flutter 03/10/2013   Presented to [facility] with atrial flutter, rate 150, IV Cardizem, converted to normal sinus rhythm, February 09, 2013 and and and and and and and and and and and and and and and and and and and and and and.,  EKGs from [facility] received, reviewed, sent to be scanned   History of tobacco abuse 02/08/2010   Qualifier: Diagnosis of  By: Georgian ROSALEA CHARM Lamar    Low back pain    Multiple thyroid  nodules    Wernicke-Korsakoff syndrome (alcoholic) (HCC)     Past Surgical History:  Procedure Laterality Date   ABDOMINAL HYSTERECTOMY  1989   left oophorectomy   ANTERIOR CERVICAL DISCECTOMY     C5- C6, C6- C7 anterior cervical diskectomy and fusion with allograft and anterir plating     scar tissue removal   2004    Family History  Problem Relation Age of Onset   Cancer Mother         bone   Leukemia Father    Diabetes Brother        type 2   Coronary artery disease Neg Hx     Social History   Socioeconomic History   Marital status: Married    Spouse name: Not on file   Number of children: 2   Years of education: 12   Highest education level: Not on file  Occupational History   Occupation: Actor  Tobacco Use   Smoking status: Former    Current packs/day: 0.00    Average packs/day: 1 pack/day for 33.0 years (33.0 ttl pk-yrs)    Types: Cigarettes    Start date: 08/20/1984    Quit date: 08/20/2017    Years since quitting: 6.4   Smokeless tobacco: Never   Tobacco comments:    smoking since 1986  Vaping Use   Vaping status: Former  Substance and Sexual Activity   Alcohol use: Not Currently    Comment: Last use 2018   Drug use: Never   Sexual activity: Not on file  Other Topics Concern   Not on file  Social History Narrative   Pt lives in 2 story home with her husband   Has 2 adult children   GED - does not remember how far in school prior to  GED   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 90 tablet 3   Multiple Vitamin (MULTIVITAMIN) capsule Take 1 capsule by mouth daily.     No facility-administered medications prior to visit.    No Known Allergies  ROS See HPI    Objective:    Physical Exam Constitutional:      General: She is not in acute distress.    Appearance: Normal appearance. She is well-developed.  HENT:     Head: Normocephalic and atraumatic.     Right Ear: External ear normal.     Left Ear:  External ear normal.  Eyes:     General: No scleral icterus. Neck:     Thyroid : No thyromegaly.  Cardiovascular:     Rate and Rhythm: Normal rate and regular rhythm.     Heart sounds: Normal heart sounds. No murmur heard. Pulmonary:     Effort: Pulmonary effort is normal. No respiratory distress.     Breath sounds: Normal breath sounds. No wheezing.  Musculoskeletal:     Cervical back: Neck supple.  Skin:    General: Skin is warm and dry.  Neurological:     Mental Status: She is alert and oriented to person, place, and time.  Psychiatric:        Mood and Affect: Mood normal.        Behavior: Behavior normal.        Thought Content: Thought content normal.        Judgment: Judgment normal.      BP 126/61 (BP Location: Right Arm, Patient Position: Sitting, Cuff Size: Normal)   Pulse (!) 58   Temp 97.8 F (36.6 C) (Oral)   Resp 16   Ht 5' 6 (1.676 m)   Wt 176 lb (79.8 kg)   SpO2 99%   BMI 28.41 kg/m  Wt Readings from Last 3 Encounters:  01/20/24 176 lb (79.8 kg)  07/18/23 174 lb (78.9 kg)  12/17/22 180 lb (81.6 kg)       Assessment & Plan:   Problem List Items Addressed This Visit       Unprioritized   RESOLVED: Recurrent epistaxis - Primary   Resolved, never followed through with ENT referral.       RESOLVED: Low back pain   Stable, no recent issues.       Hypertension   BP Readings from Last 3 Encounters:  01/20/24 126/61  09/18/23 (!) 90/50  07/18/23 139/74   Stable without medications.       Depression   Stable without medications.       Counseled on importance of preventative care screenings.   I am having Arland FABIENE Pereyra maintain her multivitamin and donepezil .  No orders of the defined types were placed in this encounter.

## 2024-09-17 ENCOUNTER — Ambulatory Visit: Admitting: Physician Assistant

## 2025-01-19 ENCOUNTER — Encounter: Admitting: Family
